# Patient Record
Sex: Male | Born: 1950 | Race: Black or African American | Hispanic: No | Smoking: Current every day smoker
Health system: Southern US, Community
[De-identification: ages and names within clinical notes are randomized; demographics above are authoritative.]

## PROBLEM LIST (undated history)

## (undated) DIAGNOSIS — M1990 Unspecified osteoarthritis, unspecified site: Secondary | ICD-10-CM

## (undated) DIAGNOSIS — J45909 Unspecified asthma, uncomplicated: Secondary | ICD-10-CM

## (undated) DIAGNOSIS — J449 Chronic obstructive pulmonary disease, unspecified: Secondary | ICD-10-CM

## (undated) DIAGNOSIS — I10 Essential (primary) hypertension: Secondary | ICD-10-CM

## (undated) DIAGNOSIS — G4730 Sleep apnea, unspecified: Secondary | ICD-10-CM

## (undated) DIAGNOSIS — K829 Disease of gallbladder, unspecified: Secondary | ICD-10-CM

---

## 2000-11-08 ENCOUNTER — Encounter: Payer: Self-pay | Admitting: Internal Medicine

## 2000-11-08 ENCOUNTER — Ambulatory Visit (HOSPITAL_COMMUNITY): Admission: RE | Admit: 2000-11-08 | Discharge: 2000-11-08 | Payer: Self-pay | Admitting: Internal Medicine

## 2007-01-17 ENCOUNTER — Ambulatory Visit (HOSPITAL_COMMUNITY): Admission: RE | Admit: 2007-01-17 | Discharge: 2007-01-17 | Payer: Self-pay | Admitting: Family Medicine

## 2007-01-18 ENCOUNTER — Ambulatory Visit (HOSPITAL_COMMUNITY): Admission: RE | Admit: 2007-01-18 | Discharge: 2007-01-18 | Payer: Self-pay | Admitting: Family Medicine

## 2009-06-13 ENCOUNTER — Encounter: Payer: Self-pay | Admitting: Internal Medicine

## 2009-06-30 ENCOUNTER — Telehealth (INDEPENDENT_AMBULATORY_CARE_PROVIDER_SITE_OTHER): Payer: Self-pay

## 2009-07-01 ENCOUNTER — Encounter: Payer: Self-pay | Admitting: Internal Medicine

## 2009-07-04 ENCOUNTER — Ambulatory Visit: Payer: Self-pay | Admitting: Internal Medicine

## 2009-07-04 ENCOUNTER — Ambulatory Visit (HOSPITAL_COMMUNITY): Admission: RE | Admit: 2009-07-04 | Discharge: 2009-07-04 | Payer: Self-pay | Admitting: Internal Medicine

## 2009-07-08 ENCOUNTER — Encounter: Payer: Self-pay | Admitting: Internal Medicine

## 2009-11-01 ENCOUNTER — Ambulatory Visit (HOSPITAL_COMMUNITY): Admission: RE | Admit: 2009-11-01 | Discharge: 2009-11-01 | Payer: Self-pay | Admitting: Allergy and Immunology

## 2010-12-22 ENCOUNTER — Ambulatory Visit (HOSPITAL_COMMUNITY)
Admission: RE | Admit: 2010-12-22 | Discharge: 2010-12-22 | Disposition: A | Discharge status: Home / Self Care | Payer: BC Managed Care – PPO | Source: Ambulatory Visit | Attending: Family Medicine | Admitting: Family Medicine

## 2010-12-22 ENCOUNTER — Other Ambulatory Visit (HOSPITAL_COMMUNITY): Payer: Self-pay | Admitting: Family Medicine

## 2010-12-22 DIAGNOSIS — M545 Low back pain, unspecified: Secondary | ICD-10-CM

## 2010-12-22 DIAGNOSIS — M79609 Pain in unspecified limb: Secondary | ICD-10-CM | POA: Insufficient documentation

## 2010-12-22 DIAGNOSIS — M25559 Pain in unspecified hip: Secondary | ICD-10-CM | POA: Insufficient documentation

## 2011-07-10 ENCOUNTER — Other Ambulatory Visit (HOSPITAL_COMMUNITY): Payer: Self-pay | Admitting: Family Medicine

## 2011-07-12 ENCOUNTER — Ambulatory Visit (HOSPITAL_COMMUNITY)
Admission: RE | Admit: 2011-07-12 | Discharge: 2011-07-12 | Disposition: A | Discharge status: Home / Self Care | Payer: BC Managed Care – PPO | Source: Ambulatory Visit | Attending: Family Medicine | Admitting: Family Medicine

## 2011-07-12 DIAGNOSIS — R109 Unspecified abdominal pain: Secondary | ICD-10-CM | POA: Insufficient documentation

## 2011-07-12 DIAGNOSIS — R932 Abnormal findings on diagnostic imaging of liver and biliary tract: Secondary | ICD-10-CM | POA: Insufficient documentation

## 2012-08-10 ENCOUNTER — Emergency Department (HOSPITAL_COMMUNITY)
Admission: EM | Admit: 2012-08-10 | Discharge: 2012-08-10 | Disposition: A | Discharge status: Home / Self Care | Payer: BC Managed Care – PPO | Attending: Emergency Medicine | Admitting: Emergency Medicine

## 2012-08-10 ENCOUNTER — Encounter (HOSPITAL_COMMUNITY): Payer: Self-pay | Admitting: *Deleted

## 2012-08-10 DIAGNOSIS — F17200 Nicotine dependence, unspecified, uncomplicated: Secondary | ICD-10-CM | POA: Insufficient documentation

## 2012-08-10 DIAGNOSIS — R748 Abnormal levels of other serum enzymes: Secondary | ICD-10-CM | POA: Insufficient documentation

## 2012-08-10 DIAGNOSIS — K8020 Calculus of gallbladder without cholecystitis without obstruction: Secondary | ICD-10-CM | POA: Insufficient documentation

## 2012-08-10 DIAGNOSIS — Z8719 Personal history of other diseases of the digestive system: Secondary | ICD-10-CM | POA: Insufficient documentation

## 2012-08-10 DIAGNOSIS — K8050 Calculus of bile duct without cholangitis or cholecystitis without obstruction: Secondary | ICD-10-CM

## 2012-08-10 DIAGNOSIS — R630 Anorexia: Secondary | ICD-10-CM | POA: Insufficient documentation

## 2012-08-10 DIAGNOSIS — R110 Nausea: Secondary | ICD-10-CM | POA: Insufficient documentation

## 2012-08-10 HISTORY — DX: Disease of gallbladder, unspecified: K82.9

## 2012-08-10 LAB — COMPREHENSIVE METABOLIC PANEL
AST: 13 U/L (ref 0–37)
Albumin: 3.7 g/dL (ref 3.5–5.2)
Alkaline Phosphatase: 77 U/L (ref 39–117)
Chloride: 95 mEq/L — ABNORMAL LOW (ref 96–112)
Potassium: 4.9 mEq/L (ref 3.5–5.1)
Sodium: 133 mEq/L — ABNORMAL LOW (ref 135–145)
Total Bilirubin: 0.6 mg/dL (ref 0.3–1.2)

## 2012-08-10 LAB — URINE MICROSCOPIC-ADD ON

## 2012-08-10 LAB — CBC WITH DIFFERENTIAL/PLATELET
Basophils Absolute: 0 10*3/uL (ref 0.0–0.1)
Basophils Relative: 0 % (ref 0–1)
MCHC: 36.3 g/dL — ABNORMAL HIGH (ref 30.0–36.0)
Neutro Abs: 5.8 10*3/uL (ref 1.7–7.7)
Neutrophils Relative %: 72 % (ref 43–77)
RDW: 13.5 % (ref 11.5–15.5)

## 2012-08-10 LAB — URINALYSIS, ROUTINE W REFLEX MICROSCOPIC
Bilirubin Urine: NEGATIVE
Glucose, UA: NEGATIVE mg/dL
Leukocytes, UA: NEGATIVE
pH: 5.5 (ref 5.0–8.0)

## 2012-08-10 MED ORDER — HYDROMORPHONE HCL PF 1 MG/ML IJ SOLN
1.0000 mg | Freq: Once | INTRAMUSCULAR | Status: AC
  Administered 2012-08-10: 1 mg via INTRAVENOUS
  Filled 2012-08-10: qty 1

## 2012-08-10 MED ORDER — METOPROLOL TARTRATE 1 MG/ML IV SOLN
5.0000 mg | Freq: Once | INTRAVENOUS | Status: AC
  Administered 2012-08-10: 5 mg via INTRAVENOUS
  Filled 2012-08-10: qty 5

## 2012-08-10 MED ORDER — SODIUM CHLORIDE 0.9 % IV BOLUS (SEPSIS)
1000.0000 mL | Freq: Once | INTRAVENOUS | Status: AC
  Administered 2012-08-10: 1000 mL via INTRAVENOUS

## 2012-08-10 MED ORDER — ONDANSETRON HCL 4 MG/2ML IJ SOLN
4.0000 mg | Freq: Once | INTRAMUSCULAR | Status: AC
  Administered 2012-08-10: 4 mg via INTRAVENOUS
  Filled 2012-08-10: qty 2

## 2012-08-10 NOTE — ED Notes (Signed)
Pt c/o epigastric pain that radiates to chest area with burning sensation, pain is associated with nausea. Pain started a week ago,

## 2012-08-10 NOTE — ED Notes (Signed)
Dr Rosalia Hammers notified of [place] un-availability at this time. Aware of scheduling needed for tomorrow if still wanted.

## 2012-08-10 NOTE — ED Notes (Signed)
Pt presents with epi-gastric pain, x 3 days. Pt states pain is in upper abdomin with tenderness noted upon palpation. Pt reports no bm x 5 days. Has had pain like this about 4 years ago, was diagnosed as gallbladder disease per report. Pt refused surgery at that time.  NAD noted at this time. Pt is alert, oriented x 4.

## 2012-08-10 NOTE — ED Notes (Signed)
RN at bedside

## 2012-08-10 NOTE — ED Provider Notes (Signed)
History     CSN: [id number]  Arrival date & time 08/10/12  1228   First MD Initiated Contact with Patient 08/10/12 1445      Chief Complaint  Patient presents with  . Abdominal Pain    (Consider location/radiation/quality/duration/timing/severity/associated sxs/prior treatment) HPI Patient with epigastric pain for 3-4 days with pain and nausea.  Pain like prior pain with gallbladder.  Patient scheduled to get gb out 4 years ago but canceled and had changed diet.  He is not currently having pain but has not eaten today due to pain with eating.  No fever, chills, vomiting, diarrhea.    Past Medical History  Diagnosis Date  . Gallbladder disease     History reviewed. No pertinent past surgical history.  No family history on file.  History  Substance Use Topics  . Smoking status: Current Every Day Smoker  . Smokeless tobacco: Not on file  . Alcohol Use: No      Review of Systems  Constitutional: Positive for appetite change.  HENT: Negative.   Respiratory: Negative.   Cardiovascular: Negative.   Gastrointestinal: Positive for abdominal pain.  Genitourinary: Negative.   Musculoskeletal: Negative.   Skin: Negative.   Neurological: Negative.   Hematological: Negative.   Psychiatric/Behavioral: Negative.   All other systems reviewed and are negative.    Allergies  Aspirin  Home Medications  No current outpatient prescriptions on file.  BP 171/112  Pulse 97  Temp 98.1 F (36.7 C)  Resp 20  Ht 5' 11.5" (1.816 m)  Wt 240 lb (108.863 kg)  BMI 33.01 kg/m2  SpO2 98%  Physical Exam  Nursing note and vitals reviewed. Constitutional: He is oriented to person, place, and time. He appears well-developed and well-nourished.  HENT:  Head: Normocephalic and atraumatic.  Right Ear: External ear normal.  Left Ear: External ear normal.  Nose: Nose normal.  Mouth/Throat: Oropharynx is clear and moist.  Eyes: Conjunctivae normal and EOM are normal. Pupils are equal,  round, and reactive to light.  Neck: Normal range of motion. Neck supple.  Cardiovascular: Normal rate, regular rhythm, normal heart sounds and intact distal pulses.   Pulmonary/Chest: Effort normal and breath sounds normal. No respiratory distress. He has no wheezes. He exhibits no tenderness.  Abdominal: Soft. Bowel sounds are normal. He exhibits no distension and no mass. There is tenderness. There is no guarding.       Epigastric pain  Musculoskeletal: Normal range of motion.  Neurological: He is alert and oriented to person, place, and time. He has normal reflexes. He exhibits normal muscle tone. Coordination normal.  Skin: Skin is warm and dry.  Psychiatric: He has a normal mood and affect. His behavior is normal. Judgment and thought content normal.    ED Course  Procedures (including critical care time)   Labs Reviewed  CBC WITH DIFFERENTIAL  COMPREHENSIVE METABOLIC PANEL  LIPASE, BLOOD  URINALYSIS, ROUTINE W REFLEX MICROSCOPIC   No results found.   No diagnosis found.   Results for orders placed during the hospital encounter of 08/10/12  CBC WITH DIFFERENTIAL      Component Value Range   WBC 8.1  4.0 - 10.5 K/uL   RBC 5.54  4.22 - 5.81 MIL/uL   Hemoglobin 18.2 (*) 13.0 - 17.0 g/dL   HCT 40.9  81.1 - 91.4 %   MCV 90.4  78.0 - 100.0 fL   MCH 32.9  26.0 - 34.0 pg   MCHC 36.3 (*) 30.0 - 36.0 g/dL  RDW 13.5  11.5 - 15.5 %   Platelets 256  150 - 400 K/uL   Neutrophils Relative 72  43 - 77 %   Neutro Abs 5.8  1.7 - 7.7 K/uL   Lymphocytes Relative 20  12 - 46 %   Lymphs Abs 1.6  0.7 - 4.0 K/uL   Monocytes Relative 6  3 - 12 %   Monocytes Absolute 0.5  0.1 - 1.0 K/uL   Eosinophils Relative 3  0 - 5 %   Eosinophils Absolute 0.2  0.0 - 0.7 K/uL   Basophils Relative 0  0 - 1 %   Basophils Absolute 0.0  0.0 - 0.1 K/uL  COMPREHENSIVE METABOLIC PANEL      Component Value Range   Sodium 133 (*) 135 - 145 mEq/L   Potassium 4.9  3.5 - 5.1 mEq/L   Chloride 95 (*) 96 - 112  mEq/L   CO2 29  19 - 32 mEq/L   Glucose, Bld 122 (*) 70 - 99 mg/dL   BUN 16  6 - 23 mg/dL   Creatinine, Ser 1.61  0.50 - 1.35 mg/dL   Calcium 9.8  8.4 - 09.6 mg/dL   Total Protein 8.6 (*) 6.0 - 8.3 g/dL   Albumin 3.7  3.5 - 5.2 g/dL   AST 13  0 - 37 U/L   ALT 16  0 - 53 U/L   Alkaline Phosphatase 77  39 - 117 U/L   Total Bilirubin 0.6  0.3 - 1.2 mg/dL   GFR calc non Af Amer 62 (*) >90 mL/min   GFR calc Af Amer 72 (*) >90 mL/min  LIPASE, BLOOD      Component Value Range   Lipase 80 (*) 11 - 59 U/L    MDM    Discussed with Dr. Leticia Penna and patient  Patient offered admission but wishes d/c to follow up outpatient.  History of [place] with gallstones and no evidence of acute choleycystitis.  H.o. C.w. Biliary colic.  Patient advised clear liquids only and to take bp meds.  He is to call Dr. Illene Regulus office tomorrow morning to be seen on Tuesday.  Patient voices understanding and to return if worse especially intractable pain , fever.      Hilario Quarry, MD 08/10/12 [phone]

## 2012-08-11 ENCOUNTER — Emergency Department (HOSPITAL_COMMUNITY)
Admission: EM | Admit: 2012-08-11 | Discharge: 2012-08-11 | Disposition: A | Discharge status: Home / Self Care | Payer: BC Managed Care – PPO | Attending: Emergency Medicine | Admitting: Emergency Medicine

## 2012-08-11 ENCOUNTER — Encounter (HOSPITAL_COMMUNITY): Payer: Self-pay | Admitting: *Deleted

## 2012-08-11 DIAGNOSIS — I10 Essential (primary) hypertension: Secondary | ICD-10-CM

## 2012-08-11 DIAGNOSIS — R1013 Epigastric pain: Secondary | ICD-10-CM | POA: Insufficient documentation

## 2012-08-11 DIAGNOSIS — R109 Unspecified abdominal pain: Secondary | ICD-10-CM

## 2012-08-11 DIAGNOSIS — F17200 Nicotine dependence, unspecified, uncomplicated: Secondary | ICD-10-CM | POA: Insufficient documentation

## 2012-08-11 DIAGNOSIS — Z79899 Other long term (current) drug therapy: Secondary | ICD-10-CM | POA: Insufficient documentation

## 2012-08-11 DIAGNOSIS — Z8719 Personal history of other diseases of the digestive system: Secondary | ICD-10-CM | POA: Insufficient documentation

## 2012-08-11 DIAGNOSIS — R110 Nausea: Secondary | ICD-10-CM | POA: Insufficient documentation

## 2012-08-11 MED ORDER — ONDANSETRON HCL 8 MG PO TABS: 8.0000 mg | ORAL_TABLET | Freq: Three times a day (TID) | ORAL | Status: DC | PRN

## 2012-08-11 MED ORDER — OXYCODONE-ACETAMINOPHEN 5-325 MG PO TABS: 2.0000 | ORAL_TABLET | ORAL | Status: DC | PRN

## 2012-08-11 NOTE — ED Provider Notes (Signed)
History     CSN: [id number]  Arrival date & time 08/11/12  1016   First MD Initiated Contact with Patient 08/11/12 1114      Chief Complaint  Patient presents with  . Abdominal Pain     Patient is a 62 y.o. male presenting with abdominal pain. The history is provided by the patient. No language interpreter was used.  Abdominal Pain The primary symptoms of the illness include abdominal pain, fever and nausea. The primary symptoms of the illness do not include fatigue, shortness of breath, vomiting, diarrhea or hematemesis. The current episode started 13 to 24 hours ago. The onset of the illness was sudden. The problem has been gradually improving.  The abdominal pain began 13 to24 hours ago. The pain came on suddenly. The abdominal pain has been unchanged since its onset. The abdominal pain is located in the epigastric region. The abdominal pain does not radiate. The abdominal pain is relieved by nothing.  The fever began today. The maximum temperature recorded prior to his arrival was unknown.   Symptoms associated with the illness do not include chills or constipation.    George Mays is a 62 y.o. male who presents to the Emergency Department complaining of epigastric abdominal pain. He was seen yesterday and was diagnosed with biliary colic with elevated lipase yesterday. He states he was advised to see Dr.Zieglar tomorrow but has not called to make an appointment. He states he is feeling better than yesterday but is in pain.  Past Medical History  Diagnosis Date  . Gallbladder disease     History reviewed. No pertinent past surgical history.  No family history on file.  History  Substance Use Topics  . Smoking status: Current Every Day Smoker  . Smokeless tobacco: Not on file  . Alcohol Use: No      Review of Systems  Constitutional: Positive for fever. Negative for chills and fatigue.  Respiratory: Negative for shortness of breath.   Cardiovascular: Negative  for chest pain.  Gastrointestinal: Positive for nausea and abdominal pain. Negative for vomiting, diarrhea, constipation and hematemesis.  Neurological: Negative for weakness, light-headedness, numbness and headaches.    Allergies  Aspirin  Home Medications   Current Outpatient Rx  Name  Route  Sig  Dispense  Refill  . AMLODIPINE-OLMESARTAN 5-20 MG PO TABS   Oral   Take 1 tablet by mouth daily.         Marland Kitchen CETIRIZINE HCL 10 MG PO TABS   Oral   Take 10 mg by mouth daily.         Marland Kitchen FLUTICASONE-SALMETEROL 250-50 MCG/DOSE IN AEPB   Inhalation   Inhale 1 puff into the lungs every 12 (twelve) hours.           Triage Vitals: BP 153/101  Pulse 90  Temp 97.8 F (36.6 C) (Oral)  Resp 16  Ht 5\' 11"  (1.803 m)  Wt 240 lb (108.863 kg)  BMI 33.47 kg/m2  SpO2 95%  Physical Exam  CONSTITUTIONAL: Well developed/well nourished HEAD AND FACE: Normocephalic/atraumatic EYES: EOMI/PERRL ENMT: Mucous membranes moist NECK: supple no meningeal signs SPINE:entire spine nontender CV: S1/S2 noted, no murmurs/rubs/gallops noted LUNGS: Lungs are clear to auscultation bilaterally, no apparent distress ABDOMEN: soft, nontender, no rebound or guarding GU:no cva tenderness NEURO: Pt is awake/alert, moves all extremitiesx4 EXTREMITIES: pulses normal, full ROM SKIN: warm, color normal PSYCH: no abnormalities of mood noted   ED Course  Procedures   DIAGNOSTIC STUDIES: Oxygen Saturation is 95%  on room air, normal by my interpretation.    COORDINATION OF CARE:  11:23 AM: Discussed treatment plan which includes prescription for pain medication and an appoint with Dr. Leticia Penna with pt at bedside and pt agreed to plan.  Exam unremarkable.  Reports he has had this pain on/off for awhile with recent worsening.  Do not feel emergent imaging necessary.  F/u arranged.  Pt stable for d/c.  Short course of pain meds given    MDM  Previous records reviewed and considered Labs/vital reviewed and  considered Nursing notes including past medical history and social history reviewed and considered in documentation      Date: 08/11/2012  Rate: 77  Rhythm: normal sinus rhythm  QRS Axis: left  Intervals: normal  ST/T Wave abnormalities: nonspecific ST changes  Conduction Disutrbances:none  Narrative Interpretation:   Old EKG Reviewed: unchanged     I personally performed the services described in this documentation, which was scribed in my presence. The recorded information has been reviewed and is accurate.      Joya Gaskins, MD 08/11/12 [phone]

## 2012-08-11 NOTE — ED Notes (Addendum)
Pt dx with biliary colic with elevated lipase yesterday and is supposed to follow up with Dr. Leticia Penna tomorrow but has not called to make an appt yet this morning. States he was not given Rx pain med at time of d/c and is having a lot of pain.

## 2012-08-14 ENCOUNTER — Encounter (HOSPITAL_COMMUNITY): Payer: Self-pay | Admitting: Pharmacy Technician

## 2012-08-15 ENCOUNTER — Encounter (HOSPITAL_COMMUNITY)
Admission: RE | Admit: 2012-08-15 | Discharge: 2012-08-15 | Disposition: A | Discharge status: Home / Self Care | Payer: BC Managed Care – PPO | Source: Ambulatory Visit | Attending: Internal Medicine | Admitting: Internal Medicine

## 2012-08-15 ENCOUNTER — Encounter (HOSPITAL_COMMUNITY): Payer: Self-pay

## 2012-08-15 HISTORY — DX: Essential (primary) hypertension: I10

## 2012-08-15 HISTORY — DX: Sleep apnea, unspecified: G47.30

## 2012-08-15 HISTORY — DX: Unspecified asthma, uncomplicated: J45.909

## 2012-08-15 LAB — SURGICAL PCR SCREEN
MRSA, PCR: NEGATIVE
Staphylococcus aureus: NEGATIVE

## 2012-08-15 NOTE — Patient Instructions (Addendum)
George Mays  08/15/2012   Your procedure is scheduled on:  08/18/2012  Report to [facility] at 850  AM.  Call this number if you have problems the morning of surgery: [phone]   Remember:   Do not eat food or drink liquids after midnight.   Take these medicines the morning of surgery with A SIP OF WATER: zofran,oxycodone,azor,zyrtec   Do not wear jewelry, make-up or nail polish.  Do not wear lotions, powders, or perfumes. You may wear deodorant.  Do not shave 48 hours prior to surgery. Men may shave face and neck.  Do not bring valuables to the hospital.  Contacts, dentures or bridgework may not be worn into surgery.  Leave suitcase in the car. After surgery it may be brought to your room.  For patients admitted to the hospital, checkout time is 11:00 AM the day of discharge.   Patients discharged the day of surgery will not be allowed to drive  home.  Name and phone number of your driver: family  Special Instructions: Shower using CHG 2 nights before surgery and the night before surgery.  If you shower the day of surgery use CHG.  Use special wash - you have one bottle of CHG for all showers.  You should use approximately 1/3 of the bottle for each shower.   Please read over the following fact sheets that you were given: Pain Booklet, Coughing and Deep Breathing, MRSA Information, Surgical Site Infection Prevention, Anesthesia Post-op Instructions and Care and Recovery After Surgery Laparoscopic Cholecystectomy Laparoscopic cholecystectomy is surgery to remove the gallbladder. The gallbladder is located slightly to the right of center in the abdomen, behind the liver. It is a concentrating and storage sac for the bile produced in the liver. Bile aids in the digestion and absorption of fats. Gallbladder disease (cholecystitis) is an inflammation of your gallbladder. This condition is usually caused by a buildup of gallstones (cholelithiasis) in your gallbladder. Gallstones  can block the flow of bile, resulting in inflammation and pain. In severe cases, emergency surgery may be required. When emergency surgery is not required, you will have time to prepare for the procedure. Laparoscopic surgery is an alternative to open surgery. Laparoscopic surgery usually has a shorter recovery time. Your common bile duct may also need to be examined and explored. Your caregiver will discuss this with you if he or she feels this should be done. If stones are found in the common bile duct, they may be removed. LET YOUR CAREGIVER KNOW ABOUT:  Allergies to food or medicine.  Medicines taken, including vitamins, herbs, eyedrops, over-the-counter medicines, and creams.  Use of steroids (by mouth or creams).  Previous problems with anesthetics or numbing medicines.  History of bleeding problems or blood clots.  Previous surgery.  Other health problems, including diabetes and kidney problems.  Possibility of pregnancy, if this applies. RISKS AND COMPLICATIONS All surgery is associated with risks. Some problems that may occur following this procedure include:  Infection.  Damage to the common bile duct, nerves, arteries, veins, or other internal organs such as the stomach or intestines.  Bleeding.  A stone may remain in the common bile duct. BEFORE THE PROCEDURE  Do not take aspirin for 3 days prior to surgery or blood thinners for 1 week prior to surgery.  Do not eat or drink anything after midnight the night before surgery.  Let your caregiver know if you develop a cold or other infectious problem prior to  surgery.  You should be present 60 minutes before the procedure or as directed. PROCEDURE  You will be given medicine that makes you sleep (general anesthetic). When you are asleep, your surgeon will make several small cuts (incisions) in your abdomen. One of these incisions is used to insert a small, lighted scope (laparoscope) into the abdomen. The laparoscope  helps the surgeon see into your abdomen. Carbon dioxide gas will be pumped into your abdomen. The gas allows more room for the surgeon to perform your surgery. Other operating instruments are inserted through the other incisions. Laparoscopic procedures may not be appropriate when:  There is major scarring from previous surgery.  The gallbladder is extremely inflamed.  There are bleeding disorders or unexpected cirrhosis of the liver.  A pregnancy is near term.  Other conditions make the laparoscopic procedure impossible. If your surgeon feels it is not safe to continue with a laparoscopic procedure, he or she will perform an open abdominal procedure. In this case, the surgeon will make an incision to open the abdomen. This gives the surgeon a larger view and field to work within. This may allow the surgeon to perform procedures that sometimes cannot be performed with a laparoscope alone. Open surgery has a longer recovery time. AFTER THE PROCEDURE  You will be taken to the recovery area where a nurse will watch and check your progress.  You may be allowed to go home the same day.  Do not resume physical activities until directed by your caregiver.  You may resume a normal diet and activities as directed. Document Released: 07/16/2005 Document Revised: 10/08/2011 Document Reviewed: 12/29/2010 [facility] Patient Information 2013 [place], [place]. PATIENT INSTRUCTIONS POST-ANESTHESIA  IMMEDIATELY FOLLOWING SURGERY:  Do not drive or operate machinery for the first twenty four hours after surgery.  Do not make any important decisions for twenty four hours after surgery or while taking narcotic pain medications or sedatives.  If you develop intractable nausea and vomiting or a severe headache please notify your doctor immediately.  FOLLOW-UP:  Please make an appointment with your surgeon as instructed. You do not need to follow up with anesthesia unless specifically instructed to do so.  WOUND  CARE INSTRUCTIONS (if applicable):  Keep a dry clean dressing on the anesthesia/puncture wound site if there is drainage.  Once the wound has quit draining you may leave it open to air.  Generally you should leave the bandage intact for twenty four hours unless there is drainage.  If the epidural site drains for more than 36-48 hours please call the anesthesia department.  QUESTIONS?:  Please feel free to call your physician or the hospital operator if you have any questions, and they will be happy to assist you.

## 2012-08-18 ENCOUNTER — Encounter (HOSPITAL_COMMUNITY)
Admission: RE | Disposition: A | Discharge status: Home / Self Care | Payer: Self-pay | Source: Ambulatory Visit | Attending: General Surgery

## 2012-08-18 ENCOUNTER — Ambulatory Visit (HOSPITAL_COMMUNITY)
Admission: RE | Admit: 2012-08-18 | Discharge: 2012-08-18 | Disposition: A | Discharge status: Home / Self Care | Payer: BC Managed Care – PPO | Source: Ambulatory Visit | Attending: General Surgery | Admitting: General Surgery

## 2012-08-18 ENCOUNTER — Encounter (HOSPITAL_COMMUNITY): Payer: Self-pay | Admitting: Anesthesiology

## 2012-08-18 ENCOUNTER — Encounter (HOSPITAL_COMMUNITY): Payer: Self-pay | Admitting: *Deleted

## 2012-08-18 ENCOUNTER — Ambulatory Visit (HOSPITAL_COMMUNITY): Payer: BC Managed Care – PPO | Admitting: Anesthesiology

## 2012-08-18 DIAGNOSIS — I10 Essential (primary) hypertension: Secondary | ICD-10-CM | POA: Insufficient documentation

## 2012-08-18 DIAGNOSIS — G4733 Obstructive sleep apnea (adult) (pediatric): Secondary | ICD-10-CM | POA: Insufficient documentation

## 2012-08-18 DIAGNOSIS — K819 Cholecystitis, unspecified: Secondary | ICD-10-CM

## 2012-08-18 DIAGNOSIS — K8020 Calculus of gallbladder without cholecystitis without obstruction: Secondary | ICD-10-CM | POA: Insufficient documentation

## 2012-08-18 HISTORY — PX: CHOLECYSTECTOMY: SHX55

## 2012-08-18 SURGERY — LAPAROSCOPIC CHOLECYSTECTOMY
Anesthesia: General | Site: Abdomen | Wound class: Contaminated

## 2012-08-18 MED ORDER — GLYCOPYRROLATE 0.2 MG/ML IJ SOLN
INTRAMUSCULAR | Status: AC
  Filled 2012-08-18: qty 1

## 2012-08-18 MED ORDER — FENTANYL CITRATE 0.05 MG/ML IJ SOLN
25.0000 ug | INTRAMUSCULAR | Status: DC | PRN
  Administered 2012-08-18 (×4): 50 ug via INTRAVENOUS

## 2012-08-18 MED ORDER — FENTANYL CITRATE 0.05 MG/ML IJ SOLN
INTRAMUSCULAR | Status: AC
  Filled 2012-08-18: qty 5

## 2012-08-18 MED ORDER — NEOSTIGMINE METHYLSULFATE 1 MG/ML IJ SOLN
INTRAMUSCULAR | Status: AC
  Filled 2012-08-18: qty 1

## 2012-08-18 MED ORDER — BUPIVACAINE HCL (PF) 0.5 % IJ SOLN
INTRAMUSCULAR | Status: DC | PRN
  Administered 2012-08-18: 10 mL

## 2012-08-18 MED ORDER — HEMOSTATIC AGENTS (NO CHARGE) OPTIME
TOPICAL | Status: DC | PRN
  Administered 2012-08-18: 1 via TOPICAL

## 2012-08-18 MED ORDER — CEFAZOLIN SODIUM-DEXTROSE 2-3 GM-% IV SOLR
2.0000 g | INTRAVENOUS | Status: AC
  Administered 2012-08-18: 2 g via INTRAVENOUS

## 2012-08-18 MED ORDER — LACTATED RINGERS IV SOLN
INTRAVENOUS | Status: DC
  Administered 2012-08-18: 12:00:00 via INTRAVENOUS
  Administered 2012-08-18: 1000 mL via INTRAVENOUS

## 2012-08-18 MED ORDER — FENTANYL CITRATE 0.05 MG/ML IJ SOLN
INTRAMUSCULAR | Status: DC | PRN
  Administered 2012-08-18: 50 ug via INTRAVENOUS
  Administered 2012-08-18: 100 ug via INTRAVENOUS
  Administered 2012-08-18 (×2): 50 ug via INTRAVENOUS

## 2012-08-18 MED ORDER — CHLORHEXIDINE GLUCONATE 4 % EX LIQD: 1.0000 "application " | Freq: Once | CUTANEOUS | Status: DC

## 2012-08-18 MED ORDER — LACTATED RINGERS IV SOLN: INTRAVENOUS | Status: DC | PRN

## 2012-08-18 MED ORDER — BUPIVACAINE HCL (PF) 0.5 % IJ SOLN
INTRAMUSCULAR | Status: AC
  Filled 2012-08-18: qty 30

## 2012-08-18 MED ORDER — SODIUM CHLORIDE 0.9 % IR SOLN
Status: DC | PRN
  Administered 2012-08-18: 1000 mL

## 2012-08-18 MED ORDER — MIDAZOLAM HCL 2 MG/2ML IJ SOLN
1.0000 mg | INTRAMUSCULAR | Status: DC | PRN
  Administered 2012-08-18: 2 mg via INTRAVENOUS

## 2012-08-18 MED ORDER — ONDANSETRON HCL 4 MG/2ML IJ SOLN
4.0000 mg | Freq: Once | INTRAMUSCULAR | Status: AC
  Administered 2012-08-18: 4 mg via INTRAVENOUS

## 2012-08-18 MED ORDER — FENTANYL CITRATE 0.05 MG/ML IJ SOLN
INTRAMUSCULAR | Status: AC
  Filled 2012-08-18: qty 2

## 2012-08-18 MED ORDER — ONDANSETRON HCL 4 MG/2ML IJ SOLN
4.0000 mg | Freq: Once | INTRAMUSCULAR | Status: AC | PRN
  Administered 2012-08-18: 4 mg via INTRAVENOUS

## 2012-08-18 MED ORDER — MIDAZOLAM HCL 2 MG/2ML IJ SOLN
INTRAMUSCULAR | Status: AC
  Filled 2012-08-18: qty 2

## 2012-08-18 MED ORDER — ONDANSETRON HCL 4 MG/2ML IJ SOLN
INTRAMUSCULAR | Status: AC
  Filled 2012-08-18: qty 2

## 2012-08-18 MED ORDER — GLYCOPYRROLATE 0.2 MG/ML IJ SOLN
INTRAMUSCULAR | Status: AC
  Filled 2012-08-18: qty 3

## 2012-08-18 MED ORDER — OXYCODONE-ACETAMINOPHEN 5-325 MG PO TABS: 1.0000 | ORAL_TABLET | ORAL | Status: DC | PRN

## 2012-08-18 MED ORDER — SUCCINYLCHOLINE CHLORIDE 20 MG/ML IJ SOLN
INTRAMUSCULAR | Status: DC | PRN
  Administered 2012-08-18: 140 mg via INTRAVENOUS

## 2012-08-18 MED ORDER — CEFAZOLIN SODIUM-DEXTROSE 2-3 GM-% IV SOLR
INTRAVENOUS | Status: AC
  Filled 2012-08-18: qty 50

## 2012-08-18 MED ORDER — SODIUM CHLORIDE 0.9 % IR SOLN
Status: DC | PRN
  Administered 2012-08-18: 3000 mL

## 2012-08-18 MED ORDER — GLYCOPYRROLATE 0.2 MG/ML IJ SOLN
0.2000 mg | Freq: Once | INTRAMUSCULAR | Status: AC
  Administered 2012-08-18: 0.2 mg via INTRAVENOUS

## 2012-08-18 MED ORDER — ROCURONIUM BROMIDE 100 MG/10ML IV SOLN
INTRAVENOUS | Status: DC | PRN
  Administered 2012-08-18: 5 mg via INTRAVENOUS
  Administered 2012-08-18: 25 mg via INTRAVENOUS

## 2012-08-18 MED ORDER — LIDOCAINE HCL (CARDIAC) 10 MG/ML IV SOLN
INTRAVENOUS | Status: DC | PRN
  Administered 2012-08-18: 20 mg via INTRAVENOUS

## 2012-08-18 MED ORDER — PROPOFOL 10 MG/ML IV EMUL
INTRAVENOUS | Status: DC | PRN
  Administered 2012-08-18: 20 mg via INTRAVENOUS
  Administered 2012-08-18: 180 mg via INTRAVENOUS

## 2012-08-18 SURGICAL SUPPLY — 42 items
APL SKNCLS STERI-STRIP NONHPOA (GAUZE/BANDAGES/DRESSINGS) ×1
APPLIER CLIP UNV 5X34 EPIX (ENDOMECHANICALS) ×2 IMPLANT
APR XCLPCLP 20M/L UNV 34X5 (ENDOMECHANICALS) ×1
BAG HAMPER (MISCELLANEOUS) ×2 IMPLANT
BAG SPEC RTRVL LRG 6X4 10 (ENDOMECHANICALS) ×1
BENZOIN TINCTURE PRP APPL 2/3 (GAUZE/BANDAGES/DRESSINGS) ×2 IMPLANT
CLOTH BEACON ORANGE TIMEOUT ST (SAFETY) ×2 IMPLANT
COVER LIGHT HANDLE STERIS (MISCELLANEOUS) ×4 IMPLANT
DECANTER SPIKE VIAL GLASS SM (MISCELLANEOUS) ×2 IMPLANT
DEVICE TROCAR PUNCTURE CLOSURE (ENDOMECHANICALS) ×2 IMPLANT
DURAPREP 26ML APPLICATOR (WOUND CARE) ×2 IMPLANT
ELECT REM PT RETURN 9FT ADLT (ELECTROSURGICAL) ×2
ELECTRODE REM PT RTRN 9FT ADLT (ELECTROSURGICAL) ×1 IMPLANT
FILTER SMOKE EVAC LAPAROSHD (FILTER) ×2 IMPLANT
FORMALIN 10 PREFIL 120ML (MISCELLANEOUS) ×2 IMPLANT
GLOVE BIOGEL PI IND STRL 7.5 (GLOVE) ×1 IMPLANT
GLOVE BIOGEL PI INDICATOR 7.5 (GLOVE) ×1
GLOVE ECLIPSE 7.0 STRL STRAW (GLOVE) ×2 IMPLANT
GLOVE INDICATOR 7.0 STRL GRN (GLOVE) ×4 IMPLANT
GLOVE SS BIOGEL STRL SZ 6.5 (GLOVE) IMPLANT
GLOVE SUPERSENSE BIOGEL SZ 6.5 (GLOVE) ×4
GOWN STRL REIN XL XLG (GOWN DISPOSABLE) ×8 IMPLANT
HEMOSTAT SNOW SURGICEL 2X4 (HEMOSTASIS) ×2 IMPLANT
INST SET LAPROSCOPIC AP (KITS) ×2 IMPLANT
IV NS IRRIG 3000ML ARTHROMATIC (IV SOLUTION) ×2 IMPLANT
KIT ROOM TURNOVER APOR (KITS) ×2 IMPLANT
MANIFOLD NEPTUNE II (INSTRUMENTS) ×2 IMPLANT
NDL INSUFFLATION 14GA 120MM (NEEDLE) ×1 IMPLANT
NEEDLE INSUFFLATION 14GA 120MM (NEEDLE) ×2 IMPLANT
NS IRRIG 1000ML POUR BTL (IV SOLUTION) ×2 IMPLANT
PACK LAP CHOLE LZT030E (CUSTOM PROCEDURE TRAY) ×2 IMPLANT
PAD ARMBOARD 7.5X6 YLW CONV (MISCELLANEOUS) ×2 IMPLANT
POUCH SPECIMEN RETRIEVAL 10MM (ENDOMECHANICALS) ×2 IMPLANT
SET BASIN LINEN APH (SET/KITS/TRAYS/PACK) ×2 IMPLANT
SET TUBE IRRIG SUCTION NO TIP (IRRIGATION / IRRIGATOR) ×2 IMPLANT
SLEEVE Z-THREAD 5X100MM (TROCAR) ×4 IMPLANT
STRIP CLOSURE SKIN 1/2X4 (GAUZE/BANDAGES/DRESSINGS) ×3 IMPLANT
SUT MNCRL AB 4-0 PS2 18 (SUTURE) ×4 IMPLANT
SUT VIC AB 2-0 CT2 27 (SUTURE) ×2 IMPLANT
TROCAR Z-THRD FIOS HNDL 11X100 (TROCAR) ×2 IMPLANT
TROCAR Z-THREAD FIOS 5X100MM (TROCAR) ×2 IMPLANT
WARMER LAPAROSCOPE (MISCELLANEOUS) ×2 IMPLANT

## 2012-08-18 NOTE — Op Note (Signed)
Patient:  George Mays  DOB:  [birth year]  MRN:  [id number]   Preop Diagnosis:  Cholecystitis cholelithiasis  Postop Diagnosis:  The same  Procedure:  Laparoscopic cholecystectomy  Surgeon:  Dr. Tilford Pillar  Anes:  General endotracheal, 0.5% Sensorcaine plain for local  Indications:  This is a 62 year old male who presented to my office with a history of epigastric and right upper quadrant abdominal pain. Workup and evaluation was consistent for cholecystitis. Risks benefits alternatives a laparoscopic possible open cholecystectomy were discussed at length the patient including but not limited to risk of bleeding, infection, bile leak, small bowel injury,, intraoperative cardiac component events. Patient's questions and concerns were addressed the patient was consented for the planned procedure.  Procedure note:  Patient was taken to the operating room was placed in the supine position the or table time the general anesthetic is administered. Once patient was asleep he was endotracheally intubated by the nurse anesthetist. At this point his abdomen is prepped with DuraPrep solution and draped in standard fashion. A stab incision was created supraumbilically with 11 blade scalpel with additional dissection down to subcuticular tissue carried out using a Coker clamp. The clamp was then utilized to grasp the anterior normal fascia and lift his anteriorly. A Veress needle is inserted saline drop test is utilized confirm intraperitoneal placement the pneumoperitoneum was initiated. Once sufficient pneumoperitoneum was obtained an 11 mm insert overlap scope allowing visualization the trocar entering into the peritoneal cavity. At this point the inner cannulas removed the laparoscope was reinserted there is no evidence of any trocar or Veress needle placement injury. At this time the remaining trochars replaced a 5 mm in the epigastrium, a 5 mm in the midline, and a 5 mm in the right lateral  abdominal wall. Patient's placed into a reverse Trendelenburg left lateral decubitus position. The fundus of the gallbladder is grasped and lifted up and over the right lobe the liver. Blunt dissection is carried out to strip the peritoneal reflection off the infundibulum exposing both the cystic duct and cystic artery is entered into the infundibulum. A window was created by both the structures. 3 Nicholas placed proximally one distally and the cystic duct was divided between 2 most distal clips. Similarly 2 endoclips placed proximally one distally and the cystic artery and cystic arteries divided between 2 most distal clips. At this time electrocautery utilized dissect the collar free from the gallbladder fossa. During this dissection a small cholecystotomy was created with some small bile spillage. This was quickly controlled with a regular grasper. Once the gallbladder was free from the gallbladder fossa it was placed into an Endo Catch bag and placed up and over the right lobe the liver. To facilitate this the 10 mm scope is exchanged for a 5 mm scope. The surgical field was then copiously irrigated with sterile saline to the returning aspirate was clear with a suction irrigator. Inspection the gallbladder fossa indicate excellent hemostasis although the dollars fossa was noted to be raw. The endoclips noted be in good position with no evidence a bleeding or bile leak. He did place a piece of Surgicel snow into the gallbladder fossa. Hemostasis was excellent. At this time her my attention to closure.  Using an Endo Close suture passing device a 2-0 Vicryl sutures passed to the umbilical trocar site. With this suture and placed the gallbladder is retrieved was read the umbilical trocar site and intact Endo Catch bag. The gallbladder was sent as a perm specimen to pathology.  The pneumoperitoneum was evacuated. The trochars were removed. The Vicryl suture was secured. Local anesthetic was instilled. A 4-0  Monocryl was utilized reapproximate skin edges at all 4 trocar sites. The skin was washed dried moist dry towel. Benzoin is applied around incision. Half-inch are suture placed. The drapes removed. The patient was allowed to come out of general anesthetic was extubated and was transferred to the PACU in stable condition. At the conclusion of the procedure all instrument, sponge, needle counts are correct. Patient tolerated procedure extremely well.  Complications:  None apparent  EBL:  Less than 50 ML's  Specimen:  Gallbladder

## 2012-08-18 NOTE — Transfer of Care (Signed)
  Anesthesia Post-op Note  Patient: George Mays  Procedure(s) Performed: Procedure(s) (LRB) with comments: LAPAROSCOPIC CHOLECYSTECTOMY (N/A)  Patient Location: PACU  Anesthesia Type: General  Level of Consciousness: awake, alert , oriented and patient cooperative  Airway and Oxygen Therapy: Patient Spontanous Breathing and Patient connected to face mask oxygen  Post-op Pain: mild  Post-op Assessment: Post-op Vital signs reviewed, Patient's Cardiovascular Status Stable, Respiratory Function Stable, Patent Airway and No signs of Nausea or vomiting  Post-op Vital Signs: Reviewed and stable  Complications: No apparent anesthesia complications

## 2012-08-18 NOTE — Progress Notes (Signed)
Dr Jayme Cloud at bedside to check pt. Cleared for d/c to postop then home.

## 2012-08-18 NOTE — Anesthesia Procedure Notes (Signed)
Procedure Name: Intubation Date/Time: 08/18/2012 12:05 PM Performed by: Franco Nones Pre-anesthesia Checklist: Patient identified, Patient being monitored, Timeout performed, Emergency Drugs available and Suction available Patient Re-evaluated:Patient Re-evaluated prior to inductionOxygen Delivery Method: Circle System Utilized Preoxygenation: Pre-oxygenation with 100% oxygen Intubation Type: IV induction Ventilation: Mask ventilation without difficulty Laryngoscope Size: Miller and 3 Grade View: Grade I Tube type: Oral Tube size: 7.0 mm Number of attempts: 1 Airway Equipment and Method: stylet Placement Confirmation: ETT inserted through vocal cords under direct vision,  positive ETCO2 and breath sounds checked- equal and bilateral Secured at: 21 cm Tube secured with: Tape Dental Injury: Teeth and Oropharynx as per pre-operative assessment

## 2012-08-18 NOTE — Interval H&P Note (Signed)
History and Physical Interval Note:  08/18/2012 10:42 AM  George Mays  has presented today for surgery, with the diagnosis of Cholelithiasis  The various methods of treatment have been discussed with the patient and family. After consideration of risks, benefits and other options for treatment, the patient has consented to  Procedure(s) (LRB) with comments: LAPAROSCOPIC CHOLECYSTECTOMY (N/A) as a surgical intervention .  The patient's history has been reviewed, patient examined, no change in status, stable for surgery.  I have reviewed the patient's chart and labs.  Questions were answered to the patient's satisfaction.     Deitra Craine C

## 2012-08-18 NOTE — Anesthesia Postprocedure Evaluation (Signed)
  Anesthesia Post-op Note  Patient: George Mays  Procedure(s) Performed: Procedure(s) (LRB) with comments: LAPAROSCOPIC CHOLECYSTECTOMY (N/A)  Patient Location: PACU  Anesthesia Type: General  Level of Consciousness: awake, alert , oriented and patient cooperative  Airway and Oxygen Therapy: Patient Spontanous Breathing and Patient connected to face mask oxygen  Post-op Pain: mild  Post-op Assessment: Post-op Vital signs reviewed, Patient's Cardiovascular Status Stable, Respiratory Function Stable, Patent Airway and No signs of Nausea or vomiting  Post-op Vital Signs: Reviewed and stable  Complications: No apparent anesthesia complications  

## 2012-08-18 NOTE — H&P (Signed)
  NTS SOAP Note  Vital Signs:  Vitals as of: 08/14/2012: Systolic 139: Diastolic 80: Heart Rate 88: Temp 97.93F: Height 22ft 11.5in: Weight 238Lbs 0 Ounces: Pain Level 6: BMI 33  BMI : 32.73 kg/m2  Subjective: This 33 Years 15 Months old Male presents for of presents with 4 year history of epigastric abdominal pain. He had similar symptomatology began approximately 4 years ago but was able to minimize his symptomatology by avoiding fatty greasy foods. Over the last several months patient has again started of fatty greasy diet with notable increased epigastric pain associated with fatty greasy foods. Some radiation up into the chest and right upper quadrant. No shoulder discomfort. No history of jaundice. No change in bowel movements. No melena hematochezia or acholic stools. No unusual travel.  Review of Symptoms:  Constitutional:unremarkable   Head:unremarkable    Eyes:unremarkable   Nose/Mouth/Throat:unremarkablerhinorrhea Respiratory:unremarkable       as per history of present illness Genitourinary:unremarkable     Musculoskeletal:unremarkable   Skin:unremarkable Breast:unremarkable   Hematolgic/Lymphatic:unremarkable     Allergic/Immunologic:unremarkable     Past Medical History:  Obtained     Past Medical History  Surgical History: none Medical Problems: hyperlipidemia, COPD/asthma Psychiatric History: none Allergies: aspirin Medications: Azor, Advair   Social History:Obtained  Social History  Preferred Language: English Ethnicity: Not Hispanic / Latino Age: 62 Years 11 Months Marital Status:  L Alcohol: none Recreational drug(s): none   Smoking Status: Current every day smoker reviewed on 08/18/2012 Started Date: 07/31/1987 Packs per day: 0.50 Functional Status reviewed on mm/dd/yyyy ------------------------------------------------ Bathing: Normal Cooking: Normal Dressing: Normal Driving: Normal Eating:  Normal Managing Meds: Normal Oral Care: Normal Shopping: Normal Toileting: Normal Transferring: Normal Walking: Normal Cognitive Status reviewed on mm/dd/yyyy ------------------------------------------------ Attention: Normal Decision Making: Normal Language: Normal Memory: Normal Motor: Normal Perception: Normal Problem Solving: Normal Visual and Spatial: Normal   Family History:Obtained     Family History  Is there a family history of:no family history of biliary disease    Objective Information: General:  Well appearing, well nourished in no distress. Skin:     no rash or prominent lesions Head:Atraumatic; no masses; no abnormalities Eyes:  conjunctiva clear, EOM intact, PERRL Mouth:  Mucous membranes moist, no mucosal lesions. Throat:  no erythema, exudates or lesions. Neck:  Supple without lymphadenopathy.  Heart:  RRR, no murmur Lungs:    CTA bilaterally, no wheezes, rhonchi, rales.  Breathing unlabored. Abdomen:Soft, ND, no HSM, no masses.mild epigastric and right upper quadrant abdominal tenderness. no classic Murphy sign. Extremities:  No deformities, clubbing, cyanosis, or edema.    right upper quadrant ultrasound: Positive cholelithiasis Assessment:    Plan: cholelithiasis. Risks benefits and alternatives a laparoscopic possible open cholecystectomy were discussed at length the patient. Tiny options were discussed with patient who plan to proceed to his earliest convenience. His questions concerns are addressed.  Patient Education:Alternative treatments to surgery were discussed with patient (and family).  Risks and benefits  of procedure were fully explained to the patient (and family) who gave informed consent. Patient/family questions were addressed.  Follow-up:Pending Surgery

## 2012-08-18 NOTE — Progress Notes (Signed)
From OR. [place]. Moaning, groaning. Pain med per anesthesia.

## 2012-08-18 NOTE — Preoperative (Signed)
Beta Blockers   Reason not to administer Beta Blockers:Not Applicable 

## 2012-08-18 NOTE — Addendum Note (Signed)
Addendum  created 08/18/12 1336 by Marolyn Hammock, CRNA   Modules edited:Charges VN

## 2012-08-18 NOTE — Progress Notes (Signed)
Awake. Continues moaning/groaning. C/O postop abd pain. Rates pain 10. Med as noted.

## 2012-08-18 NOTE — Progress Notes (Signed)
Awake. Continues c/o postop abd pain and shoulder pain. Rates pain 10. Sleeping at intervals. Dr Jayme Cloud notified. No new orders given.

## 2012-08-18 NOTE — Anesthesia Preprocedure Evaluation (Signed)
Anesthesia Evaluation  Patient identified by MRN, date of birth, ID band Patient awake    Reviewed: Allergy & Precautions, H&P , NPO status , Patient's Chart, lab work & pertinent test results  Airway Mallampati: II TM Distance: >3 FB     Dental  (+) Teeth Intact   Pulmonary asthma , sleep apnea , Current Smoker,  breath sounds clear to auscultation        Cardiovascular hypertension, Rhythm:Regular Rate:Normal     Neuro/Psych    GI/Hepatic   Endo/Other  Morbid obesity  Renal/GU      Musculoskeletal   Abdominal   Peds  Hematology   Anesthesia Other Findings   Reproductive/Obstetrics                           Anesthesia Physical Anesthesia Plan  ASA: II  Anesthesia Plan: General   Post-op Pain Management:    Induction: Intravenous  Airway Management Planned: Oral ETT  Additional Equipment:   Intra-op Plan:   Post-operative Plan: Extubation in OR  Informed Consent: I have reviewed the patients History and Physical, chart, labs and discussed the procedure including the risks, benefits and alternatives for the proposed anesthesia with the patient or authorized representative who has indicated his/her understanding and acceptance.     Plan Discussed with:   Anesthesia Plan Comments:         Anesthesia Quick Evaluation

## 2012-08-20 ENCOUNTER — Encounter (HOSPITAL_COMMUNITY): Payer: Self-pay | Admitting: General Surgery

## 2013-05-28 ENCOUNTER — Other Ambulatory Visit (HOSPITAL_COMMUNITY): Payer: Self-pay | Admitting: Family Medicine

## 2013-05-28 ENCOUNTER — Ambulatory Visit (HOSPITAL_COMMUNITY)
Admission: RE | Admit: 2013-05-28 | Discharge: 2013-05-28 | Disposition: A | Discharge status: Home / Self Care | Payer: BC Managed Care – PPO | Source: Ambulatory Visit | Attending: Family Medicine | Admitting: Family Medicine

## 2013-05-28 DIAGNOSIS — M79604 Pain in right leg: Secondary | ICD-10-CM

## 2013-05-28 DIAGNOSIS — R609 Edema, unspecified: Secondary | ICD-10-CM

## 2013-05-28 DIAGNOSIS — M79609 Pain in unspecified limb: Secondary | ICD-10-CM | POA: Insufficient documentation

## 2013-09-21 IMAGING — US US ABDOMEN LIMITED
1 series · 14 of 25 positions shown · non-contrast
Comparison: 01/18/2007

CLINICAL DATA: Severe abdominal pain

GALLBLADDER ULTRASOUND

[Series 1: us abdomen limited · 0.26mm/px · 14 of 55 slices shown]
[im 1/55]
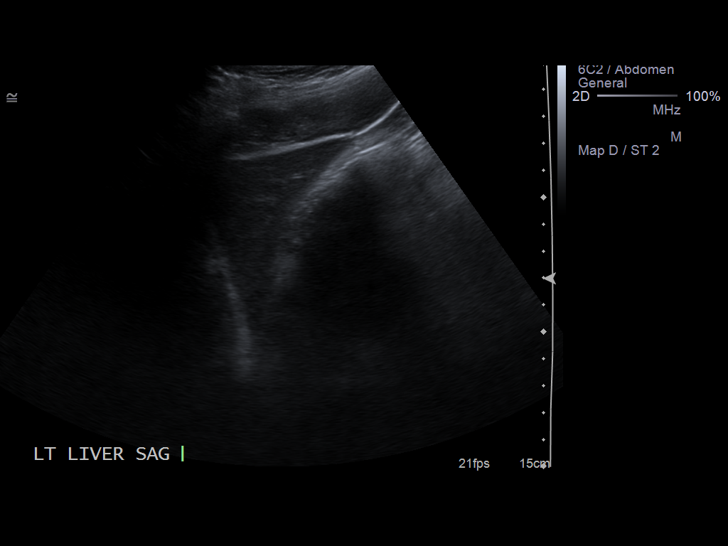
[im 5/55]
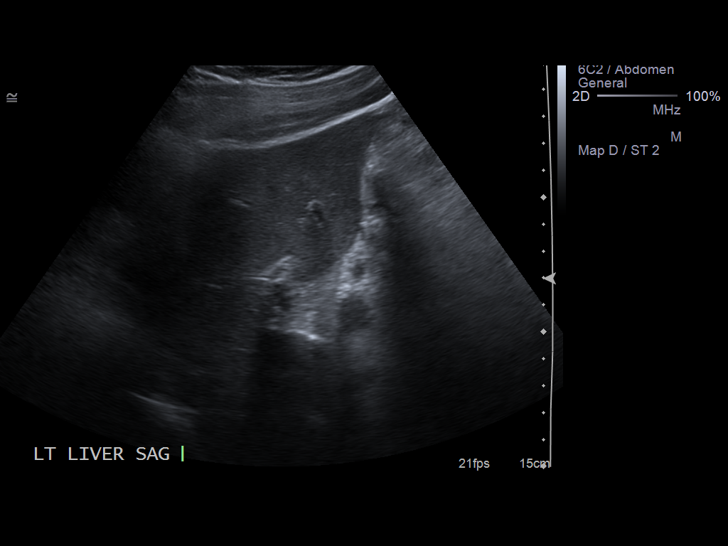
[im 10/55]
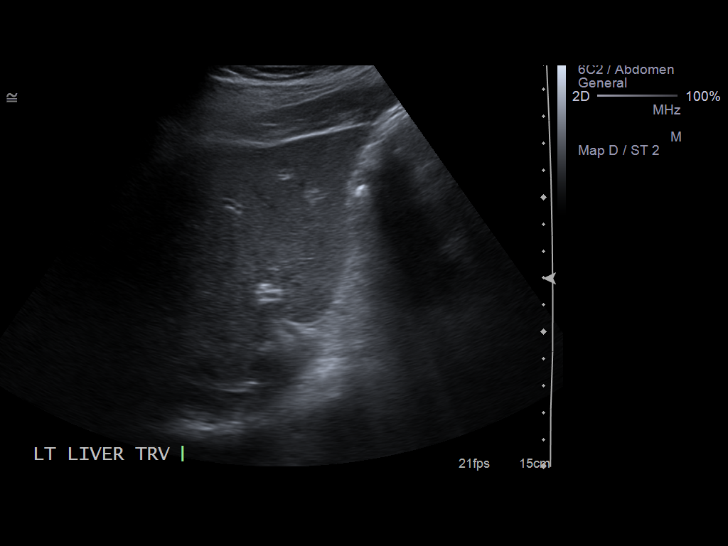
[im 14/55]
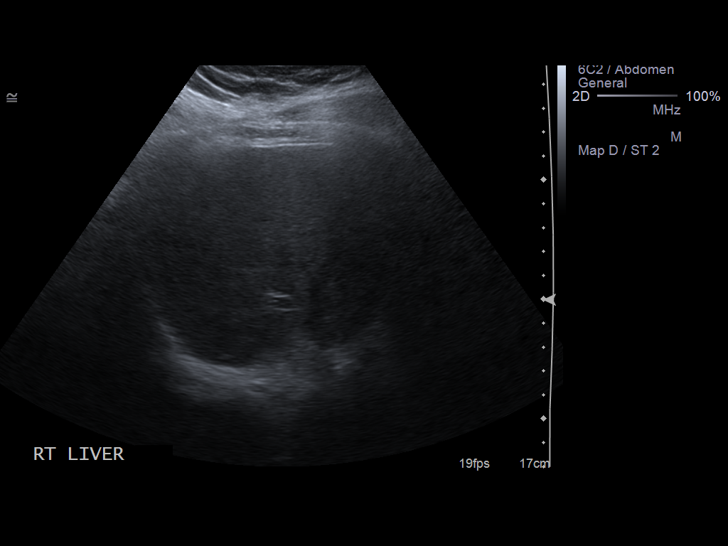
[im 19/55]
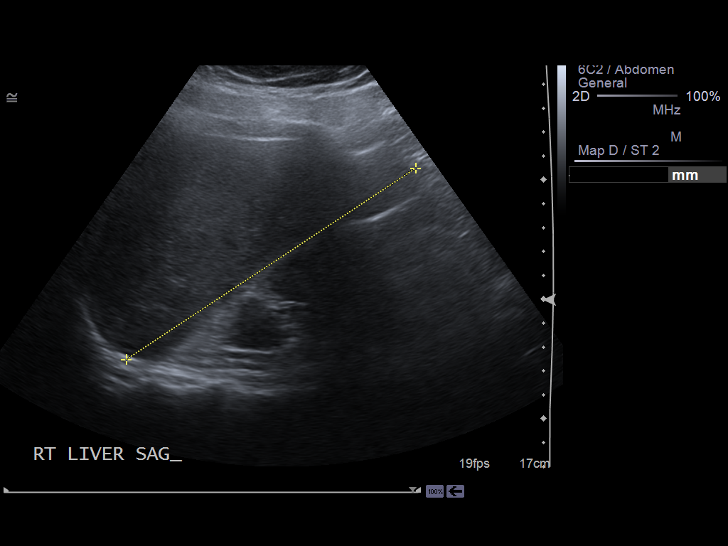
[im 21/55]
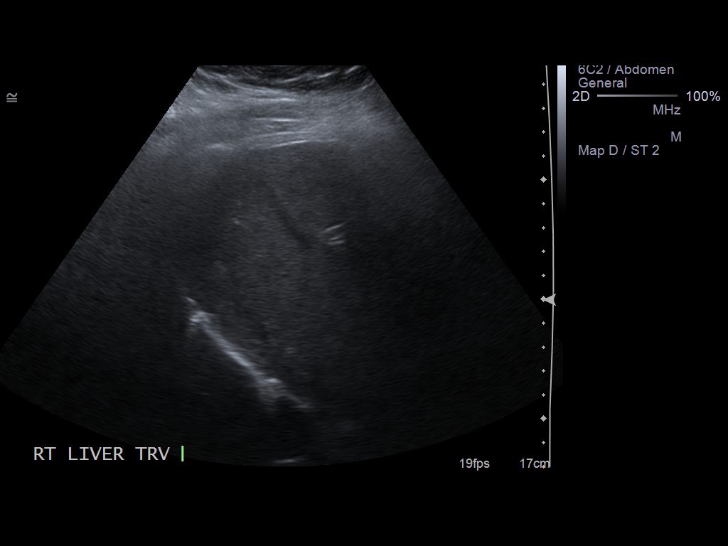
[im 25/55]
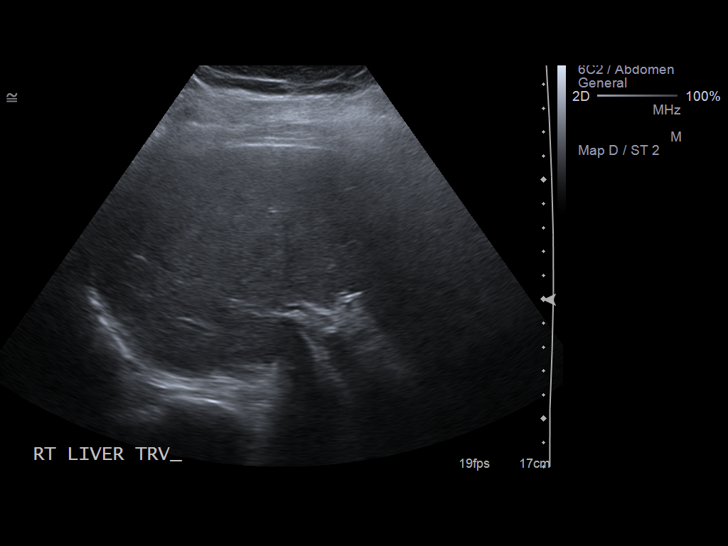
[im 30/55]
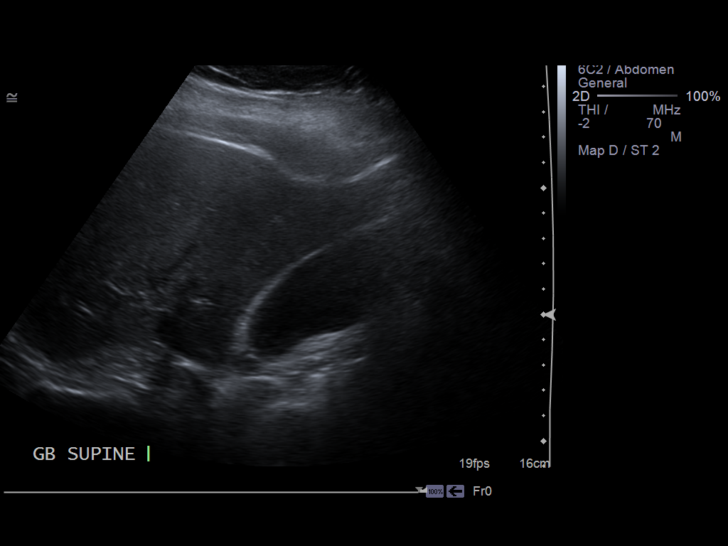
[im 34/55]
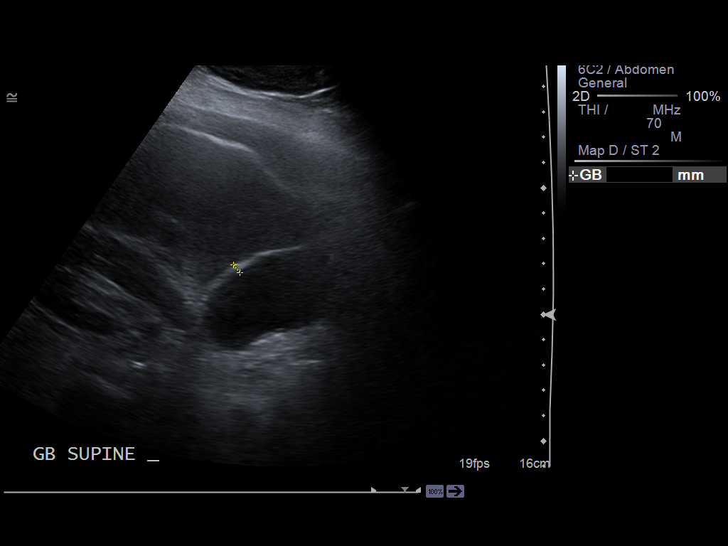
[im 37/55]
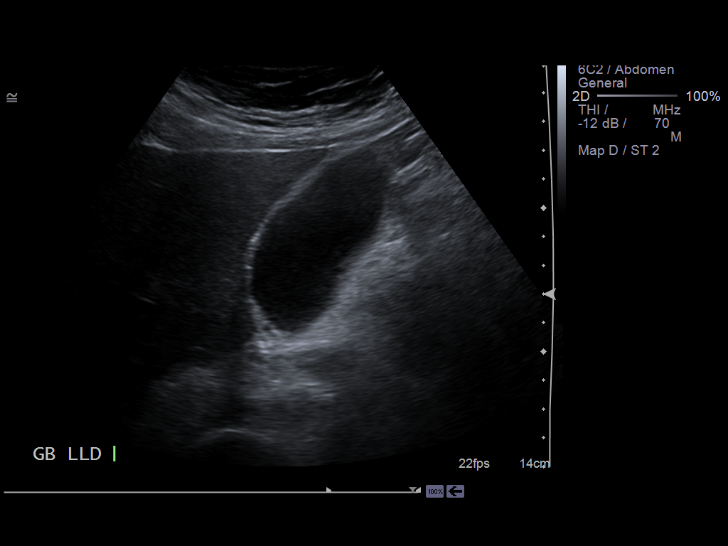
[im 41/55]
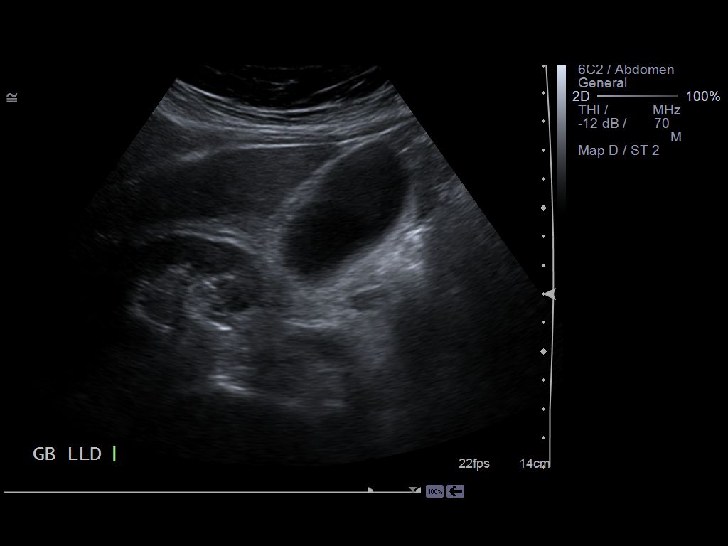
[im 46/55]
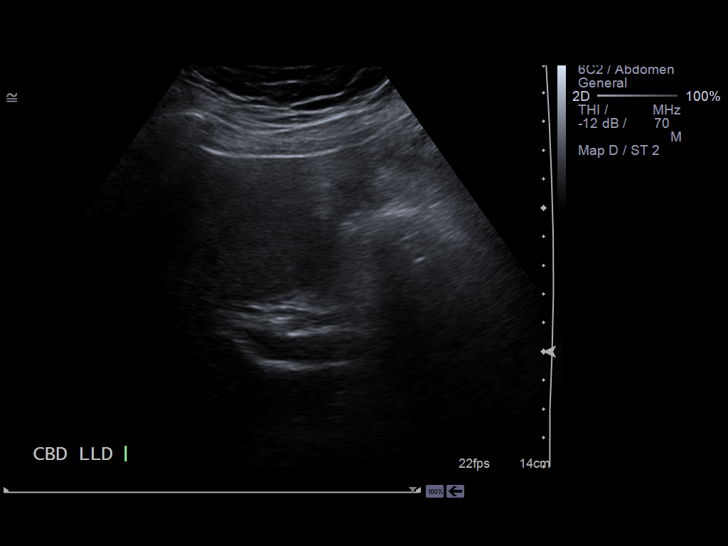
[im 50/55]
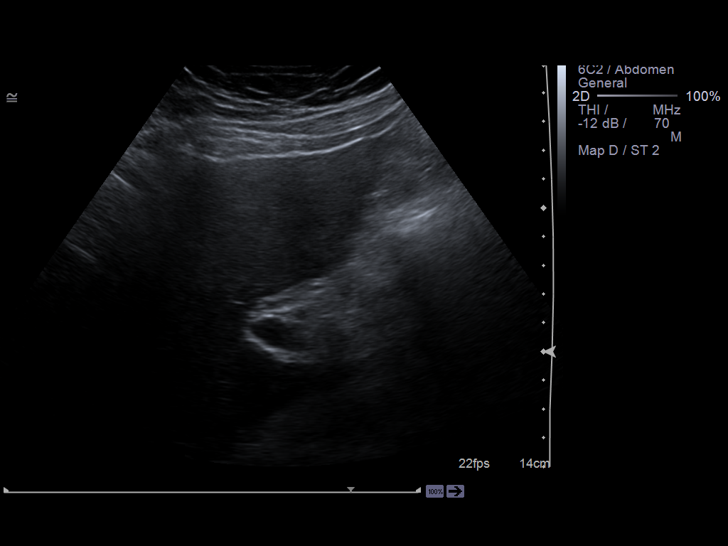
[im 55/55]
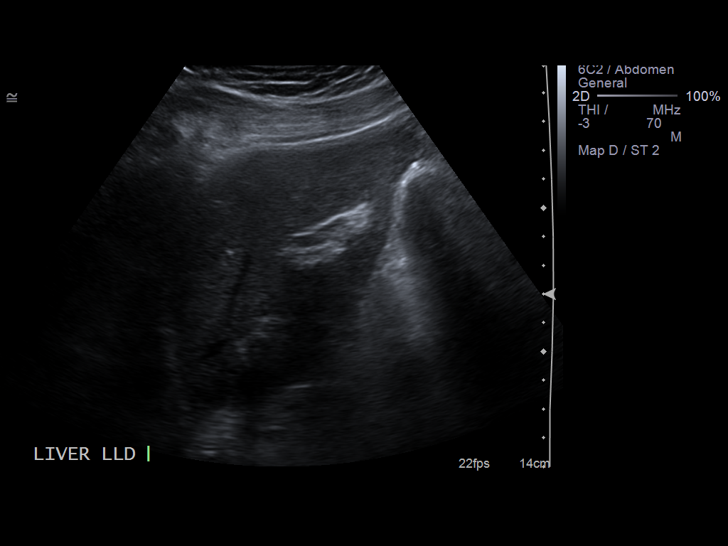

[14 of 25 positions shown; findings below may reference images not displayed]

FINDINGS: Gallbladder:  Gallbladder wall is thickened measuring 4 mm.  No
sludge or stones identified.  Negative sonographic Murphy's sign.

Common Bile Duct:  Prominent measuring 7.10 mm.

Liver:  Appears echogenic suggesting fatty infiltration.
IMPRESSION: 1.  Increased gallbladder wall thickness which may be due to
cholecystitis.  If there is a concern for cystic duct obstruction
then a nuclear medicine hepatobiliary scan would be advised.

## 2014-06-09 ENCOUNTER — Encounter: Payer: Self-pay | Admitting: Internal Medicine

## 2015-05-03 DIAGNOSIS — I10 Essential (primary) hypertension: Secondary | ICD-10-CM | POA: Diagnosis not present

## 2015-05-03 DIAGNOSIS — J441 Chronic obstructive pulmonary disease with (acute) exacerbation: Secondary | ICD-10-CM | POA: Diagnosis not present

## 2015-05-23 DIAGNOSIS — D485 Neoplasm of uncertain behavior of skin: Secondary | ICD-10-CM | POA: Diagnosis not present

## 2015-05-23 DIAGNOSIS — B079 Viral wart, unspecified: Secondary | ICD-10-CM | POA: Diagnosis not present

## 2015-05-23 DIAGNOSIS — L821 Other seborrheic keratosis: Secondary | ICD-10-CM | POA: Diagnosis not present

## 2015-06-21 DIAGNOSIS — I10 Essential (primary) hypertension: Secondary | ICD-10-CM | POA: Diagnosis not present

## 2015-06-21 DIAGNOSIS — Z6834 Body mass index (BMI) 34.0-34.9, adult: Secondary | ICD-10-CM | POA: Diagnosis not present

## 2015-06-21 DIAGNOSIS — R109 Unspecified abdominal pain: Secondary | ICD-10-CM | POA: Diagnosis not present

## 2015-06-21 DIAGNOSIS — N429 Disorder of prostate, unspecified: Secondary | ICD-10-CM | POA: Diagnosis not present

## 2015-06-21 DIAGNOSIS — R1030 Lower abdominal pain, unspecified: Secondary | ICD-10-CM | POA: Diagnosis not present

## 2015-06-29 DIAGNOSIS — Z6833 Body mass index (BMI) 33.0-33.9, adult: Secondary | ICD-10-CM | POA: Diagnosis not present

## 2015-06-29 DIAGNOSIS — R1013 Epigastric pain: Secondary | ICD-10-CM | POA: Diagnosis not present

## 2015-06-30 DIAGNOSIS — K859 Acute pancreatitis without necrosis or infection, unspecified: Secondary | ICD-10-CM | POA: Diagnosis not present

## 2015-06-30 DIAGNOSIS — K8689 Other specified diseases of pancreas: Secondary | ICD-10-CM | POA: Diagnosis not present

## 2015-07-12 DIAGNOSIS — D489 Neoplasm of uncertain behavior, unspecified: Secondary | ICD-10-CM | POA: Diagnosis not present

## 2015-07-12 DIAGNOSIS — S30860A Insect bite (nonvenomous) of lower back and pelvis, initial encounter: Secondary | ICD-10-CM | POA: Diagnosis not present

## 2015-07-26 DIAGNOSIS — Z5189 Encounter for other specified aftercare: Secondary | ICD-10-CM | POA: Diagnosis not present

## 2015-08-04 DIAGNOSIS — H2513 Age-related nuclear cataract, bilateral: Secondary | ICD-10-CM | POA: Diagnosis not present

## 2015-09-06 DIAGNOSIS — I10 Essential (primary) hypertension: Secondary | ICD-10-CM | POA: Diagnosis not present

## 2015-09-06 DIAGNOSIS — J069 Acute upper respiratory infection, unspecified: Secondary | ICD-10-CM | POA: Diagnosis not present

## 2015-09-06 DIAGNOSIS — J9801 Acute bronchospasm: Secondary | ICD-10-CM | POA: Diagnosis not present

## 2015-11-01 DIAGNOSIS — R05 Cough: Secondary | ICD-10-CM | POA: Diagnosis not present

## 2015-11-01 DIAGNOSIS — J9801 Acute bronchospasm: Secondary | ICD-10-CM | POA: Diagnosis not present

## 2015-11-01 DIAGNOSIS — J449 Chronic obstructive pulmonary disease, unspecified: Secondary | ICD-10-CM | POA: Diagnosis not present

## 2015-11-08 DIAGNOSIS — J449 Chronic obstructive pulmonary disease, unspecified: Secondary | ICD-10-CM | POA: Diagnosis not present

## 2015-12-13 DIAGNOSIS — J449 Chronic obstructive pulmonary disease, unspecified: Secondary | ICD-10-CM | POA: Diagnosis not present

## 2015-12-13 DIAGNOSIS — I10 Essential (primary) hypertension: Secondary | ICD-10-CM | POA: Diagnosis not present

## 2016-03-13 DIAGNOSIS — J449 Chronic obstructive pulmonary disease, unspecified: Secondary | ICD-10-CM | POA: Diagnosis not present

## 2016-03-13 DIAGNOSIS — I10 Essential (primary) hypertension: Secondary | ICD-10-CM | POA: Diagnosis not present

## 2016-03-13 DIAGNOSIS — Z6833 Body mass index (BMI) 33.0-33.9, adult: Secondary | ICD-10-CM | POA: Diagnosis not present

## 2016-05-08 DIAGNOSIS — J449 Chronic obstructive pulmonary disease, unspecified: Secondary | ICD-10-CM | POA: Diagnosis not present

## 2016-05-08 DIAGNOSIS — Z23 Encounter for immunization: Secondary | ICD-10-CM | POA: Diagnosis not present

## 2016-05-08 DIAGNOSIS — I10 Essential (primary) hypertension: Secondary | ICD-10-CM | POA: Diagnosis not present

## 2016-05-08 DIAGNOSIS — J9801 Acute bronchospasm: Secondary | ICD-10-CM | POA: Diagnosis not present

## 2016-08-07 DIAGNOSIS — E669 Obesity, unspecified: Secondary | ICD-10-CM | POA: Diagnosis not present

## 2016-08-07 DIAGNOSIS — H2513 Age-related nuclear cataract, bilateral: Secondary | ICD-10-CM | POA: Diagnosis not present

## 2016-08-07 DIAGNOSIS — E785 Hyperlipidemia, unspecified: Secondary | ICD-10-CM | POA: Diagnosis not present

## 2016-08-07 DIAGNOSIS — I10 Essential (primary) hypertension: Secondary | ICD-10-CM | POA: Diagnosis not present

## 2016-08-07 DIAGNOSIS — J449 Chronic obstructive pulmonary disease, unspecified: Secondary | ICD-10-CM | POA: Diagnosis not present

## 2016-09-11 DIAGNOSIS — I10 Essential (primary) hypertension: Secondary | ICD-10-CM | POA: Diagnosis not present

## 2016-09-11 DIAGNOSIS — J449 Chronic obstructive pulmonary disease, unspecified: Secondary | ICD-10-CM | POA: Diagnosis not present

## 2016-09-17 DIAGNOSIS — I10 Essential (primary) hypertension: Secondary | ICD-10-CM | POA: Diagnosis not present

## 2016-09-17 DIAGNOSIS — Z6835 Body mass index (BMI) 35.0-35.9, adult: Secondary | ICD-10-CM | POA: Diagnosis not present

## 2016-09-17 DIAGNOSIS — J309 Allergic rhinitis, unspecified: Secondary | ICD-10-CM | POA: Diagnosis not present

## 2016-09-17 DIAGNOSIS — Z0000 Encounter for general adult medical examination without abnormal findings: Secondary | ICD-10-CM | POA: Diagnosis not present

## 2016-09-17 DIAGNOSIS — E785 Hyperlipidemia, unspecified: Secondary | ICD-10-CM | POA: Diagnosis not present

## 2016-09-17 DIAGNOSIS — J449 Chronic obstructive pulmonary disease, unspecified: Secondary | ICD-10-CM | POA: Diagnosis not present

## 2016-09-17 DIAGNOSIS — R69 Illness, unspecified: Secondary | ICD-10-CM | POA: Diagnosis not present

## 2016-09-17 DIAGNOSIS — E669 Obesity, unspecified: Secondary | ICD-10-CM | POA: Diagnosis not present

## 2016-10-16 DIAGNOSIS — I10 Essential (primary) hypertension: Secondary | ICD-10-CM | POA: Diagnosis not present

## 2016-10-16 DIAGNOSIS — J9801 Acute bronchospasm: Secondary | ICD-10-CM | POA: Diagnosis not present

## 2016-10-16 DIAGNOSIS — J069 Acute upper respiratory infection, unspecified: Secondary | ICD-10-CM | POA: Diagnosis not present

## 2016-10-30 DIAGNOSIS — J9801 Acute bronchospasm: Secondary | ICD-10-CM | POA: Diagnosis not present

## 2016-10-30 DIAGNOSIS — J069 Acute upper respiratory infection, unspecified: Secondary | ICD-10-CM | POA: Diagnosis not present

## 2016-10-30 DIAGNOSIS — J040 Acute laryngitis: Secondary | ICD-10-CM | POA: Diagnosis not present

## 2017-01-22 DIAGNOSIS — G4733 Obstructive sleep apnea (adult) (pediatric): Secondary | ICD-10-CM | POA: Diagnosis not present

## 2017-01-22 DIAGNOSIS — E785 Hyperlipidemia, unspecified: Secondary | ICD-10-CM | POA: Diagnosis not present

## 2017-01-22 DIAGNOSIS — I10 Essential (primary) hypertension: Secondary | ICD-10-CM | POA: Diagnosis not present

## 2017-01-22 DIAGNOSIS — J449 Chronic obstructive pulmonary disease, unspecified: Secondary | ICD-10-CM | POA: Diagnosis not present

## 2017-02-26 DIAGNOSIS — I10 Essential (primary) hypertension: Secondary | ICD-10-CM | POA: Diagnosis not present

## 2017-02-26 DIAGNOSIS — J449 Chronic obstructive pulmonary disease, unspecified: Secondary | ICD-10-CM | POA: Diagnosis not present

## 2017-05-28 DIAGNOSIS — I10 Essential (primary) hypertension: Secondary | ICD-10-CM | POA: Diagnosis not present

## 2017-05-28 DIAGNOSIS — J449 Chronic obstructive pulmonary disease, unspecified: Secondary | ICD-10-CM | POA: Diagnosis not present

## 2017-05-28 DIAGNOSIS — M109 Gout, unspecified: Secondary | ICD-10-CM | POA: Diagnosis not present

## 2017-08-07 DIAGNOSIS — H31093 Other chorioretinal scars, bilateral: Secondary | ICD-10-CM | POA: Diagnosis not present

## 2017-08-07 DIAGNOSIS — H2513 Age-related nuclear cataract, bilateral: Secondary | ICD-10-CM | POA: Diagnosis not present

## 2017-08-22 DIAGNOSIS — H2511 Age-related nuclear cataract, right eye: Secondary | ICD-10-CM | POA: Diagnosis not present

## 2017-08-26 DIAGNOSIS — E669 Obesity, unspecified: Secondary | ICD-10-CM | POA: Diagnosis not present

## 2017-08-26 DIAGNOSIS — I10 Essential (primary) hypertension: Secondary | ICD-10-CM | POA: Diagnosis not present

## 2017-08-26 DIAGNOSIS — J449 Chronic obstructive pulmonary disease, unspecified: Secondary | ICD-10-CM | POA: Diagnosis not present

## 2017-09-17 DIAGNOSIS — R69 Illness, unspecified: Secondary | ICD-10-CM | POA: Diagnosis not present

## 2017-09-17 DIAGNOSIS — Z833 Family history of diabetes mellitus: Secondary | ICD-10-CM | POA: Diagnosis not present

## 2017-09-17 DIAGNOSIS — Z8249 Family history of ischemic heart disease and other diseases of the circulatory system: Secondary | ICD-10-CM | POA: Diagnosis not present

## 2017-09-17 DIAGNOSIS — J449 Chronic obstructive pulmonary disease, unspecified: Secondary | ICD-10-CM | POA: Diagnosis not present

## 2017-09-17 DIAGNOSIS — E785 Hyperlipidemia, unspecified: Secondary | ICD-10-CM | POA: Diagnosis not present

## 2017-09-17 DIAGNOSIS — Z803 Family history of malignant neoplasm of breast: Secondary | ICD-10-CM | POA: Diagnosis not present

## 2017-09-17 DIAGNOSIS — Z823 Family history of stroke: Secondary | ICD-10-CM | POA: Diagnosis not present

## 2017-09-17 DIAGNOSIS — I10 Essential (primary) hypertension: Secondary | ICD-10-CM | POA: Diagnosis not present

## 2017-09-22 DIAGNOSIS — H2512 Age-related nuclear cataract, left eye: Secondary | ICD-10-CM | POA: Diagnosis not present

## 2017-09-26 DIAGNOSIS — H2512 Age-related nuclear cataract, left eye: Secondary | ICD-10-CM | POA: Diagnosis not present

## 2017-11-25 DIAGNOSIS — J449 Chronic obstructive pulmonary disease, unspecified: Secondary | ICD-10-CM | POA: Diagnosis not present

## 2017-11-25 DIAGNOSIS — I10 Essential (primary) hypertension: Secondary | ICD-10-CM | POA: Diagnosis not present

## 2018-02-25 DIAGNOSIS — J449 Chronic obstructive pulmonary disease, unspecified: Secondary | ICD-10-CM | POA: Diagnosis not present

## 2018-02-25 DIAGNOSIS — I10 Essential (primary) hypertension: Secondary | ICD-10-CM | POA: Diagnosis not present

## 2018-02-25 DIAGNOSIS — N529 Male erectile dysfunction, unspecified: Secondary | ICD-10-CM | POA: Diagnosis not present

## 2018-05-27 DIAGNOSIS — J9801 Acute bronchospasm: Secondary | ICD-10-CM | POA: Diagnosis not present

## 2018-05-27 DIAGNOSIS — Z6834 Body mass index (BMI) 34.0-34.9, adult: Secondary | ICD-10-CM | POA: Diagnosis not present

## 2018-05-27 DIAGNOSIS — E785 Hyperlipidemia, unspecified: Secondary | ICD-10-CM | POA: Diagnosis not present

## 2018-05-27 DIAGNOSIS — J449 Chronic obstructive pulmonary disease, unspecified: Secondary | ICD-10-CM | POA: Diagnosis not present

## 2018-05-27 DIAGNOSIS — I10 Essential (primary) hypertension: Secondary | ICD-10-CM | POA: Diagnosis not present

## 2018-06-03 DIAGNOSIS — H31093 Other chorioretinal scars, bilateral: Secondary | ICD-10-CM | POA: Diagnosis not present

## 2018-06-03 DIAGNOSIS — H2513 Age-related nuclear cataract, bilateral: Secondary | ICD-10-CM | POA: Diagnosis not present

## 2018-06-03 DIAGNOSIS — Z961 Presence of intraocular lens: Secondary | ICD-10-CM | POA: Diagnosis not present

## 2018-07-01 DIAGNOSIS — R69 Illness, unspecified: Secondary | ICD-10-CM | POA: Diagnosis not present

## 2018-07-01 DIAGNOSIS — Z6834 Body mass index (BMI) 34.0-34.9, adult: Secondary | ICD-10-CM | POA: Diagnosis not present

## 2018-07-01 DIAGNOSIS — I10 Essential (primary) hypertension: Secondary | ICD-10-CM | POA: Diagnosis not present

## 2018-08-18 DIAGNOSIS — Z720 Tobacco use: Secondary | ICD-10-CM | POA: Diagnosis not present

## 2018-08-18 DIAGNOSIS — I10 Essential (primary) hypertension: Secondary | ICD-10-CM | POA: Diagnosis not present

## 2018-08-18 DIAGNOSIS — Z125 Encounter for screening for malignant neoplasm of prostate: Secondary | ICD-10-CM | POA: Diagnosis not present

## 2018-08-18 DIAGNOSIS — Z0000 Encounter for general adult medical examination without abnormal findings: Secondary | ICD-10-CM | POA: Diagnosis not present

## 2018-08-18 DIAGNOSIS — N400 Enlarged prostate without lower urinary tract symptoms: Secondary | ICD-10-CM | POA: Diagnosis not present

## 2018-12-15 DIAGNOSIS — J449 Chronic obstructive pulmonary disease, unspecified: Secondary | ICD-10-CM | POA: Diagnosis not present

## 2018-12-15 DIAGNOSIS — Z720 Tobacco use: Secondary | ICD-10-CM | POA: Diagnosis not present

## 2018-12-15 DIAGNOSIS — I10 Essential (primary) hypertension: Secondary | ICD-10-CM | POA: Diagnosis not present

## 2019-01-20 DIAGNOSIS — Z0130 Encounter for examination of blood pressure without abnormal findings: Secondary | ICD-10-CM | POA: Diagnosis not present

## 2019-03-17 DIAGNOSIS — J441 Chronic obstructive pulmonary disease with (acute) exacerbation: Secondary | ICD-10-CM | POA: Diagnosis not present

## 2019-03-17 DIAGNOSIS — I10 Essential (primary) hypertension: Secondary | ICD-10-CM | POA: Diagnosis not present

## 2019-03-17 DIAGNOSIS — J9801 Acute bronchospasm: Secondary | ICD-10-CM | POA: Diagnosis not present

## 2019-05-11 DIAGNOSIS — J441 Chronic obstructive pulmonary disease with (acute) exacerbation: Secondary | ICD-10-CM | POA: Diagnosis not present

## 2019-05-11 DIAGNOSIS — J9801 Acute bronchospasm: Secondary | ICD-10-CM | POA: Diagnosis not present

## 2019-05-11 DIAGNOSIS — Z720 Tobacco use: Secondary | ICD-10-CM | POA: Diagnosis not present

## 2019-05-11 DIAGNOSIS — Z7189 Other specified counseling: Secondary | ICD-10-CM | POA: Diagnosis not present

## 2019-06-04 DIAGNOSIS — R69 Illness, unspecified: Secondary | ICD-10-CM | POA: Diagnosis not present

## 2019-06-04 DIAGNOSIS — Z823 Family history of stroke: Secondary | ICD-10-CM | POA: Diagnosis not present

## 2019-06-04 DIAGNOSIS — Z803 Family history of malignant neoplasm of breast: Secondary | ICD-10-CM | POA: Diagnosis not present

## 2019-06-04 DIAGNOSIS — Z309 Encounter for contraceptive management, unspecified: Secondary | ICD-10-CM | POA: Diagnosis not present

## 2019-06-04 DIAGNOSIS — Z8249 Family history of ischemic heart disease and other diseases of the circulatory system: Secondary | ICD-10-CM | POA: Diagnosis not present

## 2019-06-04 DIAGNOSIS — J309 Allergic rhinitis, unspecified: Secondary | ICD-10-CM | POA: Diagnosis not present

## 2019-06-04 DIAGNOSIS — J449 Chronic obstructive pulmonary disease, unspecified: Secondary | ICD-10-CM | POA: Diagnosis not present

## 2019-06-04 DIAGNOSIS — I10 Essential (primary) hypertension: Secondary | ICD-10-CM | POA: Diagnosis not present

## 2019-06-04 DIAGNOSIS — N529 Male erectile dysfunction, unspecified: Secondary | ICD-10-CM | POA: Diagnosis not present

## 2019-07-21 DIAGNOSIS — B394 Histoplasmosis capsulati, unspecified: Secondary | ICD-10-CM | POA: Diagnosis not present

## 2019-07-21 DIAGNOSIS — J9801 Acute bronchospasm: Secondary | ICD-10-CM | POA: Diagnosis not present

## 2019-07-21 DIAGNOSIS — H31013 Macula scars of posterior pole (postinflammatory) (post-traumatic), bilateral: Secondary | ICD-10-CM | POA: Diagnosis not present

## 2019-07-21 DIAGNOSIS — Z961 Presence of intraocular lens: Secondary | ICD-10-CM | POA: Diagnosis not present

## 2019-07-21 DIAGNOSIS — J441 Chronic obstructive pulmonary disease with (acute) exacerbation: Secondary | ICD-10-CM | POA: Diagnosis not present

## 2019-07-31 DIAGNOSIS — I82409 Acute embolism and thrombosis of unspecified deep veins of unspecified lower extremity: Secondary | ICD-10-CM

## 2019-07-31 HISTORY — DX: Acute embolism and thrombosis of unspecified deep veins of unspecified lower extremity: I82.409

## 2019-08-04 DIAGNOSIS — Z720 Tobacco use: Secondary | ICD-10-CM | POA: Diagnosis not present

## 2019-08-04 DIAGNOSIS — J441 Chronic obstructive pulmonary disease with (acute) exacerbation: Secondary | ICD-10-CM | POA: Diagnosis not present

## 2019-08-04 DIAGNOSIS — I10 Essential (primary) hypertension: Secondary | ICD-10-CM | POA: Diagnosis not present

## 2019-09-13 ENCOUNTER — Other Ambulatory Visit: Payer: Self-pay

## 2019-09-13 ENCOUNTER — Ambulatory Visit: Payer: Medicare HMO | Attending: Internal Medicine

## 2019-09-13 DIAGNOSIS — Z23 Encounter for immunization: Secondary | ICD-10-CM | POA: Insufficient documentation

## 2019-09-13 NOTE — Progress Notes (Signed)
   Covid-19 Vaccination Clinic  Name:  George Mays    MRN: [id number] DOB: 1951-01-03  09/13/2019  Mr. George Mays was observed post Covid-19 immunization for 15 minutes without incidence. He was provided with Vaccine Information Sheet and instruction to access the V-Safe system.   Mr. George Mays was instructed to call 911 with any severe reactions post vaccine: Marland Kitchen Difficulty breathing  . Swelling of your face and throat  . A fast heartbeat  . A bad rash all over your body  . Dizziness and weakness    Immunizations Administered    Name Date Dose VIS Date Route   Moderna COVID-19 Vaccine 09/13/2019  1:51 PM 0.5 mL 06/30/2019 Intramuscular   Manufacturer: Moderna   Lot: [id number]   [id number]

## 2019-10-11 ENCOUNTER — Ambulatory Visit: Payer: Medicare HMO | Attending: Internal Medicine

## 2019-10-11 DIAGNOSIS — Z23 Encounter for immunization: Secondary | ICD-10-CM

## 2019-10-11 NOTE — Progress Notes (Signed)
   Covid-19 Vaccination Clinic  Name:  George Mays    MRN: [id number] DOB: [birth year]  10/11/2019  [name] was observed post Covid-19 immunization for 15 minutes without incident. He was provided with Vaccine Information Sheet and instruction to access the V-Safe system.   [name] was instructed to call 911 with any severe reactions post vaccine: Marland Kitchen Difficulty breathing  . Swelling of face and throat  . A fast heartbeat  . A bad rash all over body  . Dizziness and weakness   Immunizations Administered    Name Date Dose VIS Date Route   Moderna COVID-19 Vaccine 10/11/2019  1:38 PM 0.5 mL 06/30/2019 Intramuscular   Manufacturer: Moderna   Lot: [id number]   [id number]

## 2019-10-23 ENCOUNTER — Emergency Department (HOSPITAL_COMMUNITY)
Admission: EM | Admit: 2019-10-23 | Discharge: 2019-10-23 | Disposition: A | Discharge status: Home / Self Care | Payer: Medicare HMO | Attending: Emergency Medicine | Admitting: Emergency Medicine

## 2019-10-23 ENCOUNTER — Other Ambulatory Visit: Payer: Self-pay

## 2019-10-23 ENCOUNTER — Ambulatory Visit
Admission: EM | Admit: 2019-10-23 | Discharge: 2019-10-23 | Disposition: A | Discharge status: Home / Self Care | Payer: Medicare HMO | Source: Home / Self Care

## 2019-10-23 ENCOUNTER — Emergency Department (HOSPITAL_COMMUNITY): Payer: Medicare HMO

## 2019-10-23 DIAGNOSIS — I824Y1 Acute embolism and thrombosis of unspecified deep veins of right proximal lower extremity: Secondary | ICD-10-CM | POA: Diagnosis not present

## 2019-10-23 DIAGNOSIS — I10 Essential (primary) hypertension: Secondary | ICD-10-CM | POA: Insufficient documentation

## 2019-10-23 DIAGNOSIS — Z79899 Other long term (current) drug therapy: Secondary | ICD-10-CM | POA: Diagnosis not present

## 2019-10-23 DIAGNOSIS — R69 Illness, unspecified: Secondary | ICD-10-CM | POA: Diagnosis not present

## 2019-10-23 DIAGNOSIS — F17210 Nicotine dependence, cigarettes, uncomplicated: Secondary | ICD-10-CM | POA: Diagnosis not present

## 2019-10-23 DIAGNOSIS — J45909 Unspecified asthma, uncomplicated: Secondary | ICD-10-CM | POA: Insufficient documentation

## 2019-10-23 DIAGNOSIS — I82432 Acute embolism and thrombosis of left popliteal vein: Secondary | ICD-10-CM | POA: Diagnosis not present

## 2019-10-23 DIAGNOSIS — I82412 Acute embolism and thrombosis of left femoral vein: Secondary | ICD-10-CM | POA: Diagnosis not present

## 2019-10-23 DIAGNOSIS — M79604 Pain in right leg: Secondary | ICD-10-CM | POA: Diagnosis present

## 2019-10-23 MED ORDER — RIVAROXABAN (XARELTO) VTE STARTER PACK (15 & 20 MG): 15.0000 mg | ORAL_TABLET | Freq: Two times a day (BID) | ORAL | 0 refills | Status: DC

## 2019-10-23 NOTE — ED Notes (Signed)
From US

## 2019-10-23 NOTE — ED Notes (Signed)
Mr George Mays has evaluated  Pt to [place] via [place]

## 2019-10-23 NOTE — ED Notes (Signed)
Pt sent from UC after complaint of calf pain and redness.  Mr George Mays in to eval

## 2019-10-23 NOTE — ED Triage Notes (Signed)
Seen at Urgent Care this am and sent to ED.  C/o right calf pain, rating pain 8/10.  Calf is red and swollen, tender to touch.

## 2019-10-23 NOTE — ED Provider Notes (Incomplete)
Shared service with APP.  I have personally seen and examined the patient, providing direct face to face care.  Physical exam findings and plan include patient presents with persistent right lower extremity swelling.  Patient has mild tenderness in the calf and swelling right lower extremity worse than left.  No fevers or chills.  No injuries.  No shortness of breath or history of blood clot no classic blood clot risk factors.  Ultrasound confirmed blood clot and plan for anticoagulant and close outpatient follow-up. ? ?No diagnosis found.  ? ?

## 2019-10-23 NOTE — Discharge Instructions (Addendum)
Please take your medications, as prescribed.  This medication makes you at risk for bleeds.  Follow-up with your primary care provider regarding today's encounter.  I also would like you to call the office of Dr. Scot Dock, vascular surgery, to schedule an appointment for ongoing evaluation and management.  Return to the ED or seek immediate medical attention for any new or worsening symptoms.

## 2019-10-23 NOTE — ED Provider Notes (Signed)
[facility] EMERGENCY DEPARTMENT Provider Note   CSN: [id number] Arrival date & time: 10/23/19  1218     History Chief Complaint  Patient presents with  . Leg Pain    George Mays is a 69 y.o. male with PMH significant for HTN who presents to the ED with a 4-day history of right lower extremity swelling, redness, warmth, and discomfort.  Patient was sent here from an urgent care with concern for DVT.  Patient rates his right calf discomfort as 7 out of 10 and worse with palpation.  However, he states that his well controlled with Tylenol.  He reports that he was on the course golfing when he first noticed his right calf swelling and discomfort.  Since then, he has failed to improve despite elevation and Tylenol regularly.  Last night he reports mild chills.  He denies any chest pain, shortness of breath or cough, pleuritic symptoms, fevers, diminished ROM or strength, numbness, or other symptoms.  No history of clots or known clotting disorder.  No recent surgeries or immobilizations.  HPI     Past Medical History:  Diagnosis Date  . Asthma   . Gallbladder disease   . Hypertension   . Sleep apnea    does not use c-pap    There are no problems to display for this patient.   Past Surgical History:  Procedure Laterality Date  . CHOLECYSTECTOMY  08/18/2012   Procedure: LAPAROSCOPIC CHOLECYSTECTOMY;  Surgeon: Donato Heinz, MD;  Location: [facility] ORS;  Service: General;  Laterality: N/A;       No family history on file.  Social History   Tobacco Use  . Smoking status: Current Every Day Smoker    Packs/day: 0.50    Years: 40.00    Pack years: 20.00    Types: Cigarettes  Substance Use Topics  . Alcohol use: No  . Drug use: No    Home Medications Prior to Admission medications   Medication Sig Start Date End Date Taking? Authorizing Provider  amLODipine-olmesartan (AZOR) 5-20 MG per tablet Take 1 tablet by mouth daily.    [provider]  cetirizine  (ZYRTEC) 10 MG tablet Take 10 mg by mouth daily.    [provider]  Fluticasone-Salmeterol (ADVAIR) 250-50 MCG/DOSE AEPB Inhale 1 puff into the lungs every 12 (twelve) hours.    [provider]  ondansetron (ZOFRAN) 8 MG tablet Take 1 tablet (8 mg total) by mouth every 8 (eight) hours as needed for nausea. 08/11/12   Ripley Fraise, MD  oxyCODONE-acetaminophen (PERCOCET/ROXICET) 5-325 MG per tablet Take 1-2 tablets by mouth every 4 (four) hours as needed for pain. 08/18/12   [place] Primus, MD  Rivaroxaban 15 & 20 MG TBPK Take 15 mg by mouth in the morning and at bedtime for 21 days. Follow package directions: Take one 15mg  tablet by mouth twice a day. On day 22, switch to one 20mg  tablet once a day. Take with food. 10/23/19 11/13/19  Corena Herter, PA-C    Allergies    Almond (diagnostic) and Aspirin  Review of Systems   Review of Systems  Constitutional: Positive for chills. Negative for fever.  Musculoskeletal: Negative for gait problem.  Skin: Positive for color change. Negative for wound.  Neurological: Negative for weakness and numbness.    Physical Exam Updated Vital Signs BP (!) 114/92 (BP Location: Right Arm)   Pulse 96   Temp 98.3 F (36.8 C) (Oral)   Resp 16   Ht 5\' 11"  (  1.803 m)   Wt 108.9 kg   SpO2 96%   BMI 33.47 kg/m   Physical Exam Vitals and nursing note reviewed. Exam conducted with a chaperone present.  Constitutional:      Appearance: Normal appearance.  HENT:     Head: Normocephalic and atraumatic.  Eyes:     General: No scleral icterus.    Conjunctiva/sclera: Conjunctivae normal.  Cardiovascular:     Rate and Rhythm: Normal rate and regular rhythm.     Pulses: Normal pulses.     Heart sounds: Normal heart sounds.  Pulmonary:     Effort: Pulmonary effort is normal. No respiratory distress.     Breath sounds: Normal breath sounds.  Musculoskeletal:     Cervical back: Normal range of motion. No rigidity.     Comments: Right lower  leg: Greater than 3 cm swelling when compared to left calf.  Erythema and mild TTP spanning entire calf.  Warmth appreciated.  Non weeping.  No popliteal or upper leg TTP or overlying skin changes.  Evidence of chronic venous stasis dermatitis bilaterally.  Pedal pulses and sensation intact.  ROM and strength intact throughout.  No bony TTP.  Skin:    General: Skin is dry.     Capillary Refill: Capillary refill takes less than 2 seconds.  Neurological:     Mental Status: He is alert and oriented to person, place, and time.     GCS: GCS eye subscore is 4. GCS verbal subscore is 5. GCS motor subscore is 6.     Gait: Gait normal.  Psychiatric:        Mood and Affect: Mood normal.        Behavior: Behavior normal.        Thought Content: Thought content normal.     ED Results / Procedures / Treatments   Labs (all labs ordered are listed, but only abnormal results are displayed) Labs Reviewed - No data to display  EKG None  Radiology US Venous Img Lower Right (DVT Study)  Result Date: 10/23/2019 CLINICAL DATA:  69 year old with leg swelling for 5 days. EXAM: LEFT LOWER EXTREMITY VENOUS DOPPLER ULTRASOUND TECHNIQUE: Gray-scale sonography with graded compression, as well as color Doppler and duplex ultrasound were performed to evaluate the lower extremity deep venous systems from the level of the common femoral vein and including the common femoral, femoral, profunda femoral, popliteal and calf veins including the posterior tibial, peroneal and gastrocnemius veins when visible. The superficial great saphenous vein was also interrogated. Spectral Doppler was utilized to evaluate flow at rest and with distal augmentation maneuvers in the common femoral, femoral and popliteal veins. COMPARISON:  05/28/2013 FINDINGS: Contralateral Common Femoral Vein: Respiratory phasicity is normal and symmetric with the symptomatic side. No evidence of thrombus. Normal compressibility. Common Femoral Vein: No  evidence of thrombus. Normal compressibility, respiratory phasicity and response to augmentation. Saphenofemoral Junction: No evidence of thrombus. Normal compressibility and flow on color Doppler imaging. Profunda Femoral Vein: No evidence of thrombus. Normal color Doppler flow. Femoral Vein: Positive for thrombus. Thrombus in the left femoral vein protrudes into the distal common femoral vein and origin of the profunda femoral vein. Thrombus throughout the right femoral vein. The femoral vein thrombus is expansile and occlusive. Popliteal Vein: Positive for thrombus. Occlusive thrombus in the right popliteal vein. Calf Veins: Positive for thrombus. Evidence for thrombus in posterior tibial and peroneal veins. Superficial Great Saphenous Vein: No evidence of thrombus. Normal compressibility. Other Findings:  None. IMPRESSION: Positive for  thrombus in the right lower extremity. Thrombus involves the right femoral vein, right popliteal vein and right deep calf veins. Thrombus in the femoral vein and popliteal vein is occlusive. In addition, the thrombus in the proximal femoral vein has a finger-like protrusion into the distal common femoral vein region. Electronically Signed   By: Markus Daft M.D.   On: 10/23/2019 14:05    Procedures Procedures (including critical care time)  Medications Ordered in ED Medications - No data to display  ED Course  I have reviewed the triage vital signs and the nursing notes.  Pertinent labs & imaging results that were available during my care of the patient were reviewed by me and considered in my medical decision making (see chart for details).    MDM Rules/Calculators/A&P                      DVT ultrasound study was positive for thrombus in right lower extremity involving right femoral vein, right popliteal vein, and right calf veins.  This is occlusive in the femoral and popliteal veins.  Fingerlike protrusion into the distal common femoral vein region.     Patient has never had a clot before and he has no known bleeding disorders.  Patient denies any renal impairment or history of CVA/TIA.  His physical exam is not concerning for phlegmasia cerulea dolens and any mild discomfort is well controlled on Tylenol.  Spoke with Dr. Scot Dock, vascular surgery, who reports that thrombolysis is only effective when it involves the external iliac veins and if I am particularly concerned can order venogram to evaluate for iliac vein involvement.  Otherwise, patient is fine for oral anticoagulation and outpatient follow-up in 2 to 3 days with the office of Dr. Scot Dock.  While [place] did not comment on iliac vein involvement, feel as though patient safe for discharge with anticoagulation.  Discussed with Dr. Adelfa Koh who saw patient and agrees with assessment and plan.  Provide patient with information on DVT.  Discussed risks associated with rivaroxaban.  Placed on the DVT pack.  Encouraging follow-up with vascular surgery in 2 to 3 days.  Also like for him to notify his primary care provider of today's encounter.  Patient adamantly denies any renal impairment or history of bleeds.  Strict ED return precautions discussed.  All of the evaluation and work-up results were discussed with the patient and any family at bedside. They were provided opportunity to ask any additional questions and have none at this time. They have expressed understanding of verbal discharge instructions as well as return precautions and are agreeable to the plan.    Final Clinical Impression(s) / ED Diagnoses Final diagnoses:  Acute deep vein thrombosis (DVT) of proximal vein of right lower extremity ([place])    Rx / DC Orders ED Discharge Orders         Ordered    Rivaroxaban 15 & 20 MG TBPK  2 times daily     10/23/19 1446           Reita Chard 10/23/19 1450    Elnora Morrison, MD 10/24/19 1556

## 2019-10-23 NOTE — ED Triage Notes (Signed)
Pt presents with c/o right leg pain and swelling, pain is left calf and is swollen and hot to touch. rovider made aware, pt encouraged to go to ED for higher level of care to rule out blood clot

## 2019-10-26 DIAGNOSIS — Z7901 Long term (current) use of anticoagulants: Secondary | ICD-10-CM | POA: Diagnosis not present

## 2019-10-26 DIAGNOSIS — J449 Chronic obstructive pulmonary disease, unspecified: Secondary | ICD-10-CM | POA: Diagnosis not present

## 2019-10-26 DIAGNOSIS — I82401 Acute embolism and thrombosis of unspecified deep veins of right lower extremity: Secondary | ICD-10-CM | POA: Diagnosis not present

## 2019-11-17 DIAGNOSIS — J449 Chronic obstructive pulmonary disease, unspecified: Secondary | ICD-10-CM | POA: Diagnosis not present

## 2019-11-17 DIAGNOSIS — I82411 Acute embolism and thrombosis of right femoral vein: Secondary | ICD-10-CM | POA: Diagnosis not present

## 2019-11-17 DIAGNOSIS — I872 Venous insufficiency (chronic) (peripheral): Secondary | ICD-10-CM | POA: Diagnosis not present

## 2019-11-19 ENCOUNTER — Telehealth (HOSPITAL_COMMUNITY): Payer: Self-pay

## 2019-11-19 NOTE — Telephone Encounter (Signed)

## 2019-11-20 ENCOUNTER — Ambulatory Visit (INDEPENDENT_AMBULATORY_CARE_PROVIDER_SITE_OTHER): Payer: Medicare HMO | Admitting: Physician Assistant

## 2019-11-20 ENCOUNTER — Other Ambulatory Visit: Payer: Self-pay

## 2019-11-20 VITALS — BP 121/74 | HR 77 | Resp 16 | Ht 71.5 in | Wt 247.8 lb

## 2019-11-20 DIAGNOSIS — I82431 Acute embolism and thrombosis of right popliteal vein: Secondary | ICD-10-CM

## 2019-11-20 NOTE — Progress Notes (Signed)
Office Note     CC:  follow up Requesting Provider:  Iona Beard, MD  HPI: George Mays is a 69 y.o. (1951-01-31) male who presents  With diagnosis of right lower extremity deep venous thrombosis.  He presented to the emergency department where he underwent left lower extremity venous Doppler ultrasound at [facility] emergency department.  He was found to have thrombus involving the right femoral vein, popliteal vein and deep calf veins.  Thrombus was noted to protrude into the distal common femoral vein.  There was no history of trauma, immobilization or history of cancer.  The patient is active with a morning walk every day and coughing on a regular basis.  He was prescribed rivaroxaban.  He states improvement in pain and swelling since treatment initiated.  He denies rest pain, shortness of breath ,fever, chills, claudication or rest pain.  He has a compressive stocking but has not been using this regularly.  He reports improvement in lower extremity edema in the morning but states edema returns after his morning walk.  The pt not on a statin for cholesterol management.  The pt not on a daily aspirin.   Other AC: Rivaroxaban The pt is on ACE inhibitor, CCB for hypertension.   The pt is not diabetic.   Tobacco hx: 1/2 packs/day x 40 years  Past Medical History:  Diagnosis Date  . Asthma   . Gallbladder disease   . Hypertension   . Sleep apnea    does not use c-pap    Past Surgical History:  Procedure Laterality Date  . CHOLECYSTECTOMY  08/18/2012   Procedure: LAPAROSCOPIC CHOLECYSTECTOMY;  Surgeon: Donato Heinz, MD;  Location: [facility];  Service: General;  Laterality: N/A;    Social History   Socioeconomic History  . Marital status: Legally Separated    Spouse name: Not on file  . Number of children: Not on file  . Years of education: Not on file  . Highest education level: Not on file  Occupational History  . Not on file  Tobacco Use  . Smoking status: Current  Every Day Smoker    Packs/day: 0.50    Years: 40.00    Pack years: 20.00    Types: Cigarettes  Substance and Sexual Activity  . Alcohol use: No  . Drug use: No  . Sexual activity: Yes    Birth control/protection: None  Other Topics Concern  . Not on file  Social History Narrative  . Not on file   Social Determinants of Health   Financial Resource Strain:   . Difficulty of Paying Living Expenses:   Food Insecurity:   . Worried About Charity fundraiser in the Last Year:   . Arboriculturist in the Last Year:   Transportation Needs:   . Film/video editor (Medical):   Marland Kitchen Lack of Transportation (Non-Medical):   Physical Activity:   . Days of Exercise per Week:   . Minutes of Exercise per Session:   Stress:   . Feeling of Stress :   Social Connections:   . Frequency of Communication with Friends and Family:   . Frequency of Social Gatherings with Friends and Family:   . Attends Religious Services:   . Active Member of Clubs or Organizations:   . Attends Archivist Meetings:   Marland Kitchen Marital Status:   Intimate Partner Violence:   . Fear of Current or Ex-Partner:   . Emotionally Abused:   Marland Kitchen Physically Abused:   .  Sexually Abused:    History reviewed. No pertinent family history.  Current Outpatient Medications  Medication Sig Dispense Refill  . amLODipine-olmesartan (AZOR) 5-20 MG per tablet Take 1 tablet by mouth daily.    . cetirizine (ZYRTEC) 10 MG tablet Take 10 mg by mouth daily.    . Fluticasone-Salmeterol (ADVAIR) 250-50 MCG/DOSE AEPB Inhale 1 puff into the lungs every 12 (twelve) hours.    . ondansetron (ZOFRAN) 8 MG tablet Take 1 tablet (8 mg total) by mouth every 8 (eight) hours as needed for nausea. 4 tablet 0  . oxyCODONE-acetaminophen (PERCOCET/ROXICET) 5-325 MG per tablet Take 1-2 tablets by mouth every 4 (four) hours as needed for pain. 45 tablet 0  . Rivaroxaban 15 & 20 MG TBPK Take 15 mg by mouth in the morning and at bedtime for 21 days. Follow  package directions: Take one 15mg  tablet by mouth twice a day. On day 22, switch to one 20mg  tablet once a day. Take with food. 51 each 0   No current facility-administered medications for this visit.    Allergies  Allergen Reactions  . Almond (Diagnostic)   . Aspirin     n/v     REVIEW OF SYSTEMS:   [X]  denotes positive finding, [ ]  denotes negative finding Cardiac  Comments:  Chest pain or chest pressure:    Shortness of breath upon exertion:    Short of breath when lying flat:    Irregular heart rhythm:        Vascular    Pain in calf, thigh, or hip brought on by ambulation:    Pain in feet at night that wakes you up from your sleep:     Blood clot in your veins: x   Leg swelling:  x       Pulmonary    Oxygen at home:    Productive cough:     Wheezing:         Neurologic    Sudden weakness in arms or legs:     Sudden numbness in arms or legs:     Sudden onset of difficulty speaking or slurred speech:    Temporary loss of vision in one eye:     Problems with dizziness:         Gastrointestinal    Blood in stool:     Vomited blood:         Genitourinary    Burning when urinating:     Blood in urine:        Psychiatric    Major depression:         Hematologic    Bleeding problems:    Problems with blood clotting too easily:        Skin    Rashes or ulcers:        Constitutional    Fever or chills:      PHYSICAL EXAMINATION:  Vitals:   11/20/19 0827  BP: 121/74  Pulse: 77  Resp: 16  SpO2: 96%  Weight: 247 lb 12.8 oz (112.4 kg)  Height: 5' 11.5" (1.816 m)    General:  WDWN in NAD; vital signs documented above Gait: No ataxia, walks unaided HENT: WNL, normocephalic Pulmonary: normal non-labored breathing  Cardiac: regular HR,  Skin: without rashes Extremities: without ischemic changes, without Gangrene , without cellulitis; without open wounds; the patient's right lower extremity is mildly edematous as compared to the left.  Nonpitting.   Minimal calf pain with right foot dorsiflexion.  He states  this is greatly improved.  2+ palpable dorsalis pedis pulses bilaterally Musculoskeletal: no muscle wasting or atrophy  Neurologic: A&O X 3;  No focal weakness or paresthesias are detected Psychiatric:  The pt has Normal affect.      Non-Invasive Vascular Imaging:   10/23/2019 IMPRESSION: Positive for thrombus in the right lower extremity. Thrombus involves the right femoral vein, right popliteal vein and right deep calf veins. Thrombus in the femoral vein and popliteal vein is occlusive. In addition, the thrombus in the proximal femoral vein has a finger-like protrusion into the distal common femoral vein region.   ASSESSMENT/PLAN:: 69 y.o. male here for follow up for right lower extremity DVT approximately 1 month since diagnosis.  He reports improvement in edema and pain. We discussed placing his compressive stocking on before he gets out of bed each morning.  Also reminded him about frequent elevation above the heart.  He verbalizes understanding of these this.  Discussed smoking cessation . Advised him to call our office for worsening swelling, pain or shortness of breath.  Follow-up as needed    Barbie Banner, PA-C [facility] [phone]  Clinic MD:   Donzetta Matters

## 2020-01-13 DIAGNOSIS — Z008 Encounter for other general examination: Secondary | ICD-10-CM | POA: Diagnosis not present

## 2020-01-13 DIAGNOSIS — Z6833 Body mass index (BMI) 33.0-33.9, adult: Secondary | ICD-10-CM | POA: Diagnosis not present

## 2020-01-13 DIAGNOSIS — Z886 Allergy status to analgesic agent status: Secondary | ICD-10-CM | POA: Diagnosis not present

## 2020-01-13 DIAGNOSIS — E669 Obesity, unspecified: Secondary | ICD-10-CM | POA: Diagnosis not present

## 2020-01-13 DIAGNOSIS — I10 Essential (primary) hypertension: Secondary | ICD-10-CM | POA: Diagnosis not present

## 2020-01-13 DIAGNOSIS — J449 Chronic obstructive pulmonary disease, unspecified: Secondary | ICD-10-CM | POA: Diagnosis not present

## 2020-01-13 DIAGNOSIS — R69 Illness, unspecified: Secondary | ICD-10-CM | POA: Diagnosis not present

## 2020-01-13 DIAGNOSIS — N529 Male erectile dysfunction, unspecified: Secondary | ICD-10-CM | POA: Diagnosis not present

## 2020-03-07 DIAGNOSIS — J9801 Acute bronchospasm: Secondary | ICD-10-CM | POA: Diagnosis not present

## 2020-03-07 DIAGNOSIS — J449 Chronic obstructive pulmonary disease, unspecified: Secondary | ICD-10-CM | POA: Diagnosis not present

## 2020-03-07 DIAGNOSIS — I10 Essential (primary) hypertension: Secondary | ICD-10-CM | POA: Diagnosis not present

## 2020-03-07 DIAGNOSIS — J441 Chronic obstructive pulmonary disease with (acute) exacerbation: Secondary | ICD-10-CM | POA: Diagnosis not present

## 2020-03-22 DIAGNOSIS — J9801 Acute bronchospasm: Secondary | ICD-10-CM | POA: Diagnosis not present

## 2020-03-22 DIAGNOSIS — J441 Chronic obstructive pulmonary disease with (acute) exacerbation: Secondary | ICD-10-CM | POA: Diagnosis not present

## 2020-03-22 DIAGNOSIS — I10 Essential (primary) hypertension: Secondary | ICD-10-CM | POA: Diagnosis not present

## 2020-03-22 DIAGNOSIS — E785 Hyperlipidemia, unspecified: Secondary | ICD-10-CM | POA: Diagnosis not present

## 2020-03-29 DIAGNOSIS — I10 Essential (primary) hypertension: Secondary | ICD-10-CM | POA: Diagnosis not present

## 2020-03-29 DIAGNOSIS — J441 Chronic obstructive pulmonary disease with (acute) exacerbation: Secondary | ICD-10-CM | POA: Diagnosis not present

## 2020-03-29 DIAGNOSIS — Z720 Tobacco use: Secondary | ICD-10-CM | POA: Diagnosis not present

## 2020-03-29 DIAGNOSIS — E785 Hyperlipidemia, unspecified: Secondary | ICD-10-CM | POA: Diagnosis not present

## 2020-05-20 ENCOUNTER — Ambulatory Visit
Admission: EM | Admit: 2020-05-20 | Discharge: 2020-05-20 | Disposition: A | Discharge status: Home / Self Care | Payer: Medicare HMO | Attending: Emergency Medicine | Admitting: Emergency Medicine

## 2020-05-20 ENCOUNTER — Other Ambulatory Visit: Payer: Self-pay

## 2020-05-20 DIAGNOSIS — J0100 Acute maxillary sinusitis, unspecified: Secondary | ICD-10-CM | POA: Diagnosis not present

## 2020-05-20 DIAGNOSIS — Z1152 Encounter for screening for COVID-19: Secondary | ICD-10-CM | POA: Diagnosis not present

## 2020-05-20 MED ORDER — PREDNISONE 10 MG PO TABS: 20.0000 mg | ORAL_TABLET | Freq: Every day | ORAL | 0 refills | Status: DC

## 2020-05-20 MED ORDER — AMOXICILLIN-POT CLAVULANATE 875-125 MG PO TABS: 1.0000 | ORAL_TABLET | Freq: Two times a day (BID) | ORAL | 0 refills | Status: DC

## 2020-05-20 NOTE — ED Provider Notes (Signed)
[facility]   [id number] 05/20/20 Arrival Time: 0263   Chief Complaint  Patient presents with  . Nasal Congestion     SUBJECTIVE: History from: patient and family.  BENEDICT KUE is a 69 y.o. male who presented to the urgent care for complaint of nasal congestion and facial pressure for the past 1 to 2-week.  Report green nasal discharge denies sick exposure to COVID, flu or strep.  Denies recent travel.  Has no tried any OTC medication.  Denies aggravating factors.  Denies previous symptoms in the past.   Denies fever, chills, fatigue, sinus pain, rhinorrhea, sore throat, SOB, wheezing, chest pain, nausea, changes in bowel or bladder habits.     ROS: As per HPI.  All other pertinent ROS negative.     Past Medical History:  Diagnosis Date  . Asthma   . Gallbladder disease   . Hypertension   . Sleep apnea    does not use c-pap   Past Surgical History:  Procedure Laterality Date  . CHOLECYSTECTOMY  08/18/2012   Procedure: LAPAROSCOPIC CHOLECYSTECTOMY;  Surgeon: Donato Heinz, MD;  Location: [facility];  Service: General;  Laterality: N/A;   Allergies  Allergen Reactions  . Almond (Diagnostic)   . Aspirin     n/v   No current facility-administered medications on file prior to encounter.   Current Outpatient Medications on File Prior to Encounter  Medication Sig Dispense Refill  . amLODipine-olmesartan (AZOR) 5-20 MG per tablet Take 1 tablet by mouth daily.    . cetirizine (ZYRTEC) 10 MG tablet Take 10 mg by mouth daily.    . Fluticasone-Salmeterol (ADVAIR) 250-50 MCG/DOSE AEPB Inhale 1 puff into the lungs every 12 (twelve) hours.    . ondansetron (ZOFRAN) 8 MG tablet Take 1 tablet (8 mg total) by mouth every 8 (eight) hours as needed for nausea. 4 tablet 0  . oxyCODONE-acetaminophen (PERCOCET/ROXICET) 5-325 MG per tablet Take 1-2 tablets by mouth every 4 (four) hours as needed for pain. 45 tablet 0  . Rivaroxaban 15 & 20 MG TBPK Take 15 mg by mouth in the  morning and at bedtime for 21 days. Follow package directions: Take one 15mg  tablet by mouth twice a day. On day 22, switch to one 20mg  tablet once a day. Take with food. 51 each 0   Social History   Socioeconomic History  . Marital status: Legally Separated    Spouse name: Not on file  . Number of children: Not on file  . Years of education: Not on file  . Highest education level: Not on file  Occupational History  . Not on file  Tobacco Use  . Smoking status: Current Every Day Smoker    Packs/day: 0.50    Years: 40.00    Pack years: 20.00    Types: Cigarettes  Substance and Sexual Activity  . Alcohol use: No  . Drug use: No  . Sexual activity: Yes    Birth control/protection: None  Other Topics Concern  . Not on file  Social History Narrative  . Not on file   Social Determinants of Health   Financial Resource Strain:   . Difficulty of Paying Living Expenses: Not on file  Food Insecurity:   . Worried About Charity fundraiser in the Last Year: Not on file  . Ran Out of Food in the Last Year: Not on file  Transportation Needs:   . Lack of Transportation (Medical): Not on file  . Lack of Transportation (  Non-Medical): Not on file  Physical Activity:   . Days of Exercise per Week: Not on file  . Minutes of Exercise per Session: Not on file  Stress:   . Feeling of Stress : Not on file  Social Connections:   . Frequency of Communication with Friends and Family: Not on file  . Frequency of Social Gatherings with Friends and Family: Not on file  . Attends Religious Services: Not on file  . Active Member of Clubs or Organizations: Not on file  . Attends Archivist Meetings: Not on file  . Marital Status: Not on file  Intimate Partner Violence:   . Fear of Current or Ex-Partner: Not on file  . Emotionally Abused: Not on file  . Physically Abused: Not on file  . Sexually Abused: Not on file   History reviewed. No pertinent family  history.  OBJECTIVE:  Vitals:   05/20/20 1500  BP: (!) 166/104  Pulse: 80  Resp: 20  Temp: 98.4 F (36.9 C)  SpO2: 93%     Physical Exam Vitals and nursing note reviewed.  Constitutional:      General: He is not in acute distress.    Appearance: Normal appearance. He is normal weight. He is not ill-appearing, toxic-appearing or diaphoretic.  HENT:     Head: Normocephalic.     Right Ear: Tympanic membrane, ear canal and external ear normal. There is no impacted cerumen.     Left Ear: Tympanic membrane, ear canal and external ear normal.     Nose: Congestion present.     Right Sinus: Maxillary sinus tenderness present.     Left Sinus: Maxillary sinus tenderness present.  Cardiovascular:     Rate and Rhythm: Normal rate and regular rhythm.     Pulses: Normal pulses.     Heart sounds: Normal heart sounds. No murmur heard.  No friction rub. No gallop.   Pulmonary:     Effort: Pulmonary effort is normal. No respiratory distress.     Breath sounds: No stridor. Wheezing present. No rhonchi or rales.  Chest:     Chest wall: No tenderness.  Neurological:     Mental Status: He is alert and oriented to person, place, and time.    LABS:  No results found for this or any previous visit (from the past 24 hour(s)).   ASSESSMENT & PLAN:  1. Acute non-recurrent maxillary sinusitis     Meds ordered this encounter  Medications  . amoxicillin-clavulanate (AUGMENTIN) 875-125 MG tablet    Sig: Take 1 tablet by mouth every 12 (twelve) hours.    Dispense:  14 tablet    Refill:  0  . predniSONE (DELTASONE) 10 MG tablet    Sig: Take 2 tablets (20 mg total) by mouth daily.    Dispense:  15 tablet    Refill:  0    Discharge instructions  Rest and push fluids Augmentin prescribed.  Take as directed and to completion Prednisone as prescribed take as directed Continue with OTC ibuprofen/tylenol as needed for pain Follow up with PCP or [facility] if symptoms persists Return  or go to the ED if you have any new or worsening symptoms such as fever, chills, worsening sinus pain/pressure, cough, sore throat, chest pain, shortness of breath, abdominal pain, changes in bowel or bladder habits, etc...   Reviewed expectations re: course of current medical issues. Questions answered. Outlined signs and symptoms indicating need for more acute intervention. Patient verbalized understanding. After Visit Summary given.  Emerson Monte, FNP 05/20/20 1526

## 2020-05-20 NOTE — ED Triage Notes (Signed)
Pt presents with nasal congestion and head pressure for over a week

## 2020-05-20 NOTE — Discharge Instructions (Signed)
Rest and push fluids Augmentin prescribed.  Take as directed and to completion Prednisone as prescribed take as directed Continue with OTC ibuprofen/tylenol as needed for pain Follow up with PCP or [facility] if symptoms persists Return or go to the ED if you have any new or worsening symptoms such as fever, chills, worsening sinus pain/pressure, cough, sore throat, chest pain, shortness of breath, abdominal pain, changes in bowel or bladder habits, etc..Marland Kitchen

## 2020-05-21 LAB — NOVEL CORONAVIRUS, NAA: SARS-CoV-2, NAA: NOT DETECTED

## 2020-05-21 LAB — SARS-COV-2, NAA 2 DAY TAT

## 2020-06-28 DIAGNOSIS — E785 Hyperlipidemia, unspecified: Secondary | ICD-10-CM | POA: Diagnosis not present

## 2020-06-28 DIAGNOSIS — Z720 Tobacco use: Secondary | ICD-10-CM | POA: Diagnosis not present

## 2020-06-28 DIAGNOSIS — J441 Chronic obstructive pulmonary disease with (acute) exacerbation: Secondary | ICD-10-CM | POA: Diagnosis not present

## 2020-06-28 DIAGNOSIS — I10 Essential (primary) hypertension: Secondary | ICD-10-CM | POA: Diagnosis not present

## 2020-07-12 DIAGNOSIS — J441 Chronic obstructive pulmonary disease with (acute) exacerbation: Secondary | ICD-10-CM | POA: Diagnosis not present

## 2020-07-19 DIAGNOSIS — Z720 Tobacco use: Secondary | ICD-10-CM | POA: Diagnosis not present

## 2020-07-19 DIAGNOSIS — J441 Chronic obstructive pulmonary disease with (acute) exacerbation: Secondary | ICD-10-CM | POA: Diagnosis not present

## 2020-07-19 DIAGNOSIS — I10 Essential (primary) hypertension: Secondary | ICD-10-CM | POA: Diagnosis not present

## 2020-08-02 DIAGNOSIS — Z961 Presence of intraocular lens: Secondary | ICD-10-CM | POA: Diagnosis not present

## 2020-08-02 DIAGNOSIS — B394 Histoplasmosis capsulati, unspecified: Secondary | ICD-10-CM | POA: Diagnosis not present

## 2020-08-02 DIAGNOSIS — H31013 Macula scars of posterior pole (postinflammatory) (post-traumatic), bilateral: Secondary | ICD-10-CM | POA: Diagnosis not present

## 2020-08-22 DIAGNOSIS — Z125 Encounter for screening for malignant neoplasm of prostate: Secondary | ICD-10-CM | POA: Diagnosis not present

## 2020-08-22 DIAGNOSIS — I10 Essential (primary) hypertension: Secondary | ICD-10-CM | POA: Diagnosis not present

## 2020-08-22 DIAGNOSIS — Z0001 Encounter for general adult medical examination with abnormal findings: Secondary | ICD-10-CM | POA: Diagnosis not present

## 2020-08-22 DIAGNOSIS — R69 Illness, unspecified: Secondary | ICD-10-CM | POA: Diagnosis not present

## 2020-08-22 DIAGNOSIS — J449 Chronic obstructive pulmonary disease, unspecified: Secondary | ICD-10-CM | POA: Diagnosis not present

## 2020-08-22 DIAGNOSIS — Z7189 Other specified counseling: Secondary | ICD-10-CM | POA: Diagnosis not present

## 2020-08-22 DIAGNOSIS — Z720 Tobacco use: Secondary | ICD-10-CM | POA: Diagnosis not present

## 2020-08-27 DIAGNOSIS — I10 Essential (primary) hypertension: Secondary | ICD-10-CM | POA: Diagnosis not present

## 2020-08-27 DIAGNOSIS — E785 Hyperlipidemia, unspecified: Secondary | ICD-10-CM | POA: Diagnosis not present

## 2020-08-27 DIAGNOSIS — Z720 Tobacco use: Secondary | ICD-10-CM | POA: Diagnosis not present

## 2020-08-27 DIAGNOSIS — J441 Chronic obstructive pulmonary disease with (acute) exacerbation: Secondary | ICD-10-CM | POA: Diagnosis not present

## 2020-09-15 DIAGNOSIS — J449 Chronic obstructive pulmonary disease, unspecified: Secondary | ICD-10-CM | POA: Diagnosis not present

## 2020-09-15 DIAGNOSIS — R69 Illness, unspecified: Secondary | ICD-10-CM | POA: Diagnosis not present

## 2020-09-15 DIAGNOSIS — G4733 Obstructive sleep apnea (adult) (pediatric): Secondary | ICD-10-CM | POA: Diagnosis not present

## 2020-09-15 DIAGNOSIS — J309 Allergic rhinitis, unspecified: Secondary | ICD-10-CM | POA: Diagnosis not present

## 2020-09-15 DIAGNOSIS — E669 Obesity, unspecified: Secondary | ICD-10-CM | POA: Diagnosis not present

## 2020-09-15 DIAGNOSIS — Z6834 Body mass index (BMI) 34.0-34.9, adult: Secondary | ICD-10-CM | POA: Diagnosis not present

## 2020-09-15 DIAGNOSIS — E785 Hyperlipidemia, unspecified: Secondary | ICD-10-CM | POA: Diagnosis not present

## 2020-09-15 DIAGNOSIS — N529 Male erectile dysfunction, unspecified: Secondary | ICD-10-CM | POA: Diagnosis not present

## 2020-09-15 DIAGNOSIS — I10 Essential (primary) hypertension: Secondary | ICD-10-CM | POA: Diagnosis not present

## 2020-09-15 DIAGNOSIS — G8929 Other chronic pain: Secondary | ICD-10-CM | POA: Diagnosis not present

## 2020-09-26 DIAGNOSIS — R0981 Nasal congestion: Secondary | ICD-10-CM | POA: Diagnosis not present

## 2020-09-26 DIAGNOSIS — I10 Essential (primary) hypertension: Secondary | ICD-10-CM | POA: Diagnosis not present

## 2020-09-26 DIAGNOSIS — J449 Chronic obstructive pulmonary disease, unspecified: Secondary | ICD-10-CM | POA: Diagnosis not present

## 2020-10-24 DIAGNOSIS — J449 Chronic obstructive pulmonary disease, unspecified: Secondary | ICD-10-CM | POA: Diagnosis not present

## 2020-10-24 DIAGNOSIS — I10 Essential (primary) hypertension: Secondary | ICD-10-CM | POA: Diagnosis not present

## 2020-10-24 DIAGNOSIS — R0981 Nasal congestion: Secondary | ICD-10-CM | POA: Diagnosis not present

## 2020-10-24 DIAGNOSIS — Z0000 Encounter for general adult medical examination without abnormal findings: Secondary | ICD-10-CM | POA: Diagnosis not present

## 2020-10-24 DIAGNOSIS — Z720 Tobacco use: Secondary | ICD-10-CM | POA: Diagnosis not present

## 2020-11-26 DIAGNOSIS — J441 Chronic obstructive pulmonary disease with (acute) exacerbation: Secondary | ICD-10-CM | POA: Diagnosis not present

## 2020-11-26 DIAGNOSIS — I10 Essential (primary) hypertension: Secondary | ICD-10-CM | POA: Diagnosis not present

## 2020-11-26 DIAGNOSIS — E785 Hyperlipidemia, unspecified: Secondary | ICD-10-CM | POA: Diagnosis not present

## 2020-11-26 DIAGNOSIS — Z720 Tobacco use: Secondary | ICD-10-CM | POA: Diagnosis not present

## 2021-01-16 DIAGNOSIS — R0981 Nasal congestion: Secondary | ICD-10-CM | POA: Diagnosis not present

## 2021-01-16 DIAGNOSIS — R69 Illness, unspecified: Secondary | ICD-10-CM | POA: Diagnosis not present

## 2021-01-16 DIAGNOSIS — I10 Essential (primary) hypertension: Secondary | ICD-10-CM | POA: Diagnosis not present

## 2021-01-16 DIAGNOSIS — J449 Chronic obstructive pulmonary disease, unspecified: Secondary | ICD-10-CM | POA: Diagnosis not present

## 2021-01-26 DIAGNOSIS — E785 Hyperlipidemia, unspecified: Secondary | ICD-10-CM | POA: Diagnosis not present

## 2021-01-26 DIAGNOSIS — Z720 Tobacco use: Secondary | ICD-10-CM | POA: Diagnosis not present

## 2021-01-26 DIAGNOSIS — I10 Essential (primary) hypertension: Secondary | ICD-10-CM | POA: Diagnosis not present

## 2021-01-26 DIAGNOSIS — J441 Chronic obstructive pulmonary disease with (acute) exacerbation: Secondary | ICD-10-CM | POA: Diagnosis not present

## 2021-02-02 DIAGNOSIS — J324 Chronic pansinusitis: Secondary | ICD-10-CM | POA: Diagnosis not present

## 2021-02-02 DIAGNOSIS — J3489 Other specified disorders of nose and nasal sinuses: Secondary | ICD-10-CM | POA: Diagnosis not present

## 2021-02-02 DIAGNOSIS — J339 Nasal polyp, unspecified: Secondary | ICD-10-CM | POA: Diagnosis not present

## 2021-02-21 DIAGNOSIS — J0140 Acute pansinusitis, unspecified: Secondary | ICD-10-CM | POA: Diagnosis not present

## 2021-02-21 DIAGNOSIS — J3489 Other specified disorders of nose and nasal sinuses: Secondary | ICD-10-CM | POA: Diagnosis not present

## 2021-02-21 DIAGNOSIS — J339 Nasal polyp, unspecified: Secondary | ICD-10-CM | POA: Diagnosis not present

## 2021-02-21 DIAGNOSIS — J330 Polyp of nasal cavity: Secondary | ICD-10-CM | POA: Diagnosis not present

## 2021-02-21 DIAGNOSIS — J324 Chronic pansinusitis: Secondary | ICD-10-CM | POA: Diagnosis not present

## 2021-02-21 DIAGNOSIS — J342 Deviated nasal septum: Secondary | ICD-10-CM | POA: Diagnosis not present

## 2021-03-02 DIAGNOSIS — J3489 Other specified disorders of nose and nasal sinuses: Secondary | ICD-10-CM | POA: Diagnosis not present

## 2021-03-02 DIAGNOSIS — J339 Nasal polyp, unspecified: Secondary | ICD-10-CM | POA: Diagnosis not present

## 2021-03-29 DIAGNOSIS — I10 Essential (primary) hypertension: Secondary | ICD-10-CM | POA: Diagnosis not present

## 2021-03-29 DIAGNOSIS — J441 Chronic obstructive pulmonary disease with (acute) exacerbation: Secondary | ICD-10-CM | POA: Diagnosis not present

## 2021-03-29 DIAGNOSIS — E785 Hyperlipidemia, unspecified: Secondary | ICD-10-CM | POA: Diagnosis not present

## 2021-03-29 DIAGNOSIS — Z720 Tobacco use: Secondary | ICD-10-CM | POA: Diagnosis not present

## 2021-04-18 DIAGNOSIS — J449 Chronic obstructive pulmonary disease, unspecified: Secondary | ICD-10-CM | POA: Diagnosis not present

## 2021-04-18 DIAGNOSIS — K8500 Idiopathic acute pancreatitis without necrosis or infection: Secondary | ICD-10-CM | POA: Diagnosis not present

## 2021-04-18 DIAGNOSIS — R0981 Nasal congestion: Secondary | ICD-10-CM | POA: Diagnosis not present

## 2021-04-18 DIAGNOSIS — I10 Essential (primary) hypertension: Secondary | ICD-10-CM | POA: Diagnosis not present

## 2021-04-18 DIAGNOSIS — J441 Chronic obstructive pulmonary disease with (acute) exacerbation: Secondary | ICD-10-CM | POA: Diagnosis not present

## 2021-05-25 ENCOUNTER — Other Ambulatory Visit: Payer: Self-pay | Admitting: Otolaryngology

## 2021-05-29 DIAGNOSIS — J441 Chronic obstructive pulmonary disease with (acute) exacerbation: Secondary | ICD-10-CM | POA: Diagnosis not present

## 2021-05-29 DIAGNOSIS — I10 Essential (primary) hypertension: Secondary | ICD-10-CM | POA: Diagnosis not present

## 2021-05-29 DIAGNOSIS — E785 Hyperlipidemia, unspecified: Secondary | ICD-10-CM | POA: Diagnosis not present

## 2021-06-07 ENCOUNTER — Encounter (HOSPITAL_BASED_OUTPATIENT_CLINIC_OR_DEPARTMENT_OTHER): Payer: Self-pay | Admitting: Otolaryngology

## 2021-06-07 ENCOUNTER — Other Ambulatory Visit: Payer: Self-pay

## 2021-06-13 ENCOUNTER — Other Ambulatory Visit: Payer: Self-pay

## 2021-06-13 ENCOUNTER — Encounter (HOSPITAL_BASED_OUTPATIENT_CLINIC_OR_DEPARTMENT_OTHER)
Admission: RE | Admit: 2021-06-13 | Discharge: 2021-06-13 | Disposition: A | Discharge status: Home / Self Care | Payer: Medicare HMO | Source: Ambulatory Visit | Attending: Otolaryngology | Admitting: Otolaryngology

## 2021-06-13 DIAGNOSIS — Z01810 Encounter for preprocedural cardiovascular examination: Secondary | ICD-10-CM | POA: Insufficient documentation

## 2021-06-13 NOTE — Progress Notes (Signed)
Sent message reminding pt to come in for EKG today.

## 2021-06-14 ENCOUNTER — Other Ambulatory Visit: Payer: Self-pay

## 2021-06-14 ENCOUNTER — Encounter (HOSPITAL_BASED_OUTPATIENT_CLINIC_OR_DEPARTMENT_OTHER): Payer: Self-pay | Admitting: Otolaryngology

## 2021-06-14 ENCOUNTER — Encounter (HOSPITAL_BASED_OUTPATIENT_CLINIC_OR_DEPARTMENT_OTHER)
Admission: RE | Disposition: A | Discharge status: Home / Self Care | Payer: Self-pay | Source: Home / Self Care | Attending: Otolaryngology

## 2021-06-14 ENCOUNTER — Ambulatory Visit (HOSPITAL_BASED_OUTPATIENT_CLINIC_OR_DEPARTMENT_OTHER): Payer: Medicare HMO | Admitting: Anesthesiology

## 2021-06-14 ENCOUNTER — Ambulatory Visit (HOSPITAL_BASED_OUTPATIENT_CLINIC_OR_DEPARTMENT_OTHER)
Admission: RE | Admit: 2021-06-14 | Discharge: 2021-06-14 | Disposition: A | Discharge status: Home / Self Care | Payer: Medicare HMO | Attending: Otolaryngology | Admitting: Otolaryngology

## 2021-06-14 DIAGNOSIS — J339 Nasal polyp, unspecified: Secondary | ICD-10-CM | POA: Diagnosis not present

## 2021-06-14 DIAGNOSIS — F17200 Nicotine dependence, unspecified, uncomplicated: Secondary | ICD-10-CM | POA: Diagnosis not present

## 2021-06-14 DIAGNOSIS — R69 Illness, unspecified: Secondary | ICD-10-CM | POA: Diagnosis not present

## 2021-06-14 DIAGNOSIS — J324 Chronic pansinusitis: Secondary | ICD-10-CM | POA: Diagnosis not present

## 2021-06-14 DIAGNOSIS — J338 Other polyp of sinus: Secondary | ICD-10-CM | POA: Diagnosis not present

## 2021-06-14 DIAGNOSIS — J331 Polypoid sinus degeneration: Secondary | ICD-10-CM | POA: Diagnosis not present

## 2021-06-14 DIAGNOSIS — I10 Essential (primary) hypertension: Secondary | ICD-10-CM | POA: Diagnosis not present

## 2021-06-14 DIAGNOSIS — J343 Hypertrophy of nasal turbinates: Secondary | ICD-10-CM | POA: Diagnosis not present

## 2021-06-14 DIAGNOSIS — J3489 Other specified disorders of nose and nasal sinuses: Secondary | ICD-10-CM | POA: Diagnosis not present

## 2021-06-14 HISTORY — DX: Chronic obstructive pulmonary disease, unspecified: J44.9

## 2021-06-14 HISTORY — DX: Unspecified osteoarthritis, unspecified site: M19.90

## 2021-06-14 HISTORY — PX: ENDOSCOPIC TURBINATE REDUCTION: SHX6489

## 2021-06-14 HISTORY — PX: SINUS ENDO W/FUSION: SHX777

## 2021-06-14 SURGERY — SINUS SURGERY, ENDOSCOPIC, USING COMPUTER-ASSISTED NAVIGATION
Anesthesia: General | Site: Nose | Laterality: Bilateral

## 2021-06-14 MED ORDER — SUGAMMADEX SODIUM 500 MG/5ML IV SOLN
INTRAVENOUS | Status: AC
  Filled 2021-06-14: qty 5

## 2021-06-14 MED ORDER — FENTANYL CITRATE (PF) 100 MCG/2ML IJ SOLN
INTRAMUSCULAR | Status: AC
  Filled 2021-06-14: qty 2

## 2021-06-14 MED ORDER — DEXAMETHASONE SODIUM PHOSPHATE 10 MG/ML IJ SOLN
INTRAMUSCULAR | Status: AC
  Filled 2021-06-14: qty 1

## 2021-06-14 MED ORDER — HEMOSTATIC AGENTS (NO CHARGE) OPTIME
TOPICAL | Status: DC | PRN
  Administered 2021-06-14: 1 via TOPICAL

## 2021-06-14 MED ORDER — CEFAZOLIN SODIUM-DEXTROSE 2-4 GM/100ML-% IV SOLN
INTRAVENOUS | Status: AC
  Filled 2021-06-14: qty 100

## 2021-06-14 MED ORDER — MIDAZOLAM HCL 5 MG/5ML IJ SOLN
INTRAMUSCULAR | Status: DC | PRN
  Administered 2021-06-14: 2 mg via INTRAVENOUS

## 2021-06-14 MED ORDER — FENTANYL CITRATE (PF) 100 MCG/2ML IJ SOLN
INTRAMUSCULAR | Status: DC | PRN
  Administered 2021-06-14: 100 ug via INTRAVENOUS
  Administered 2021-06-14 (×2): 50 ug via INTRAVENOUS

## 2021-06-14 MED ORDER — AMISULPRIDE (ANTIEMETIC) 5 MG/2ML IV SOLN: 10.0000 mg | Freq: Once | INTRAVENOUS | Status: DC | PRN

## 2021-06-14 MED ORDER — LIDOCAINE-EPINEPHRINE 1 %-1:100000 IJ SOLN
INTRAMUSCULAR | Status: AC
  Filled 2021-06-14: qty 1

## 2021-06-14 MED ORDER — ACETAMINOPHEN 500 MG PO TABS
1000.0000 mg | ORAL_TABLET | Freq: Once | ORAL | Status: AC
  Administered 2021-06-14: 1000 mg via ORAL

## 2021-06-14 MED ORDER — EPHEDRINE 5 MG/ML INJ
INTRAVENOUS | Status: AC
  Filled 2021-06-14: qty 5

## 2021-06-14 MED ORDER — OXYMETAZOLINE HCL 0.05 % NA SOLN
NASAL | Status: AC
  Filled 2021-06-14: qty 30

## 2021-06-14 MED ORDER — 0.9 % SODIUM CHLORIDE (POUR BTL) OPTIME
TOPICAL | Status: DC | PRN
  Administered 2021-06-14: 200 mL

## 2021-06-14 MED ORDER — LACTATED RINGERS IV SOLN: INTRAVENOUS | Status: DC

## 2021-06-14 MED ORDER — LIDOCAINE 2% (20 MG/ML) 5 ML SYRINGE
INTRAMUSCULAR | Status: AC
  Filled 2021-06-14: qty 5

## 2021-06-14 MED ORDER — OXYMETAZOLINE HCL 0.05 % NA SOLN
NASAL | Status: DC | PRN
  Administered 2021-06-14: 1 via TOPICAL

## 2021-06-14 MED ORDER — DOCUSATE SODIUM 100 MG PO CAPS: 100.0000 mg | ORAL_CAPSULE | Freq: Two times a day (BID) | ORAL | 0 refills | Status: AC | PRN

## 2021-06-14 MED ORDER — EPHEDRINE SULFATE 50 MG/ML IJ SOLN
INTRAMUSCULAR | Status: DC | PRN
  Administered 2021-06-14: 20 mg via INTRAVENOUS

## 2021-06-14 MED ORDER — MUPIROCIN 2 % EX OINT
TOPICAL_OINTMENT | CUTANEOUS | Status: DC | PRN
  Administered 2021-06-14: 1 via TOPICAL

## 2021-06-14 MED ORDER — ACETAMINOPHEN 500 MG PO TABS
ORAL_TABLET | ORAL | Status: AC
  Filled 2021-06-14: qty 2

## 2021-06-14 MED ORDER — PROPOFOL 10 MG/ML IV BOLUS
INTRAVENOUS | Status: DC | PRN
  Administered 2021-06-14: 150 mg via INTRAVENOUS

## 2021-06-14 MED ORDER — SUGAMMADEX SODIUM 200 MG/2ML IV SOLN
INTRAVENOUS | Status: DC | PRN
  Administered 2021-06-14: 200 mg via INTRAVENOUS

## 2021-06-14 MED ORDER — FENTANYL CITRATE (PF) 100 MCG/2ML IJ SOLN: 25.0000 ug | INTRAMUSCULAR | Status: DC | PRN

## 2021-06-14 MED ORDER — SODIUM CHLORIDE 0.9 % IV SOLN
INTRAVENOUS | Status: AC | PRN
  Administered 2021-06-14: 200 mL via INTRAMUSCULAR

## 2021-06-14 MED ORDER — ONDANSETRON HCL 4 MG/2ML IJ SOLN: 4.0000 mg | Freq: Once | INTRAMUSCULAR | Status: DC | PRN

## 2021-06-14 MED ORDER — HYDROCODONE-ACETAMINOPHEN 5-325 MG PO TABS: 1.0000 | ORAL_TABLET | Freq: Four times a day (QID) | ORAL | 0 refills | Status: AC | PRN

## 2021-06-14 MED ORDER — DEXAMETHASONE SODIUM PHOSPHATE 4 MG/ML IJ SOLN
INTRAMUSCULAR | Status: DC | PRN
  Administered 2021-06-14: 10 mg via INTRAVENOUS

## 2021-06-14 MED ORDER — ONDANSETRON HCL 4 MG/2ML IJ SOLN
INTRAMUSCULAR | Status: AC
  Filled 2021-06-14: qty 2

## 2021-06-14 MED ORDER — TRANEXAMIC ACID-NACL 1000-0.7 MG/100ML-% IV SOLN
INTRAVENOUS | Status: DC | PRN
  Administered 2021-06-14: 1000 mg via INTRAVENOUS

## 2021-06-14 MED ORDER — MIDAZOLAM HCL 2 MG/2ML IJ SOLN
INTRAMUSCULAR | Status: AC
  Filled 2021-06-14: qty 2

## 2021-06-14 MED ORDER — OXYCODONE HCL 5 MG/5ML PO SOLN: 5.0000 mg | Freq: Once | ORAL | Status: DC | PRN

## 2021-06-14 MED ORDER — LIDOCAINE-EPINEPHRINE 1 %-1:100000 IJ SOLN
INTRAMUSCULAR | Status: DC | PRN
  Administered 2021-06-14: 10 mL

## 2021-06-14 MED ORDER — ONDANSETRON HCL 4 MG/2ML IJ SOLN
INTRAMUSCULAR | Status: DC | PRN
  Administered 2021-06-14: 4 mg via INTRAVENOUS

## 2021-06-14 MED ORDER — ROCURONIUM BROMIDE 100 MG/10ML IV SOLN
INTRAVENOUS | Status: DC | PRN
  Administered 2021-06-14: 60 mg via INTRAVENOUS

## 2021-06-14 MED ORDER — OXYCODONE HCL 5 MG PO TABS: 5.0000 mg | ORAL_TABLET | Freq: Once | ORAL | Status: DC | PRN

## 2021-06-14 MED ORDER — LIDOCAINE HCL (CARDIAC) PF 100 MG/5ML IV SOSY
PREFILLED_SYRINGE | INTRAVENOUS | Status: DC | PRN
  Administered 2021-06-14: 60 mg via INTRAVENOUS

## 2021-06-14 MED ORDER — MUPIROCIN 2 % EX OINT
TOPICAL_OINTMENT | CUTANEOUS | Status: AC
  Filled 2021-06-14: qty 22

## 2021-06-14 SURGICAL SUPPLY — 73 items
ATTRACTOMAT 16X20 MAGNETIC DRP (DRAPES) IMPLANT
BALLN FRONTAL NUVENT 6X17 70D (BALLOONS) ×3
BALLOON FRONTAL NVNT 6X17 70D (BALLOONS) IMPLANT
BLADE INF TURB ROT M4 2 5PK (BLADE) ×1 IMPLANT
BLADE INF TURB ROT M4 2MM 5PK (BLADE) ×1
BLADE ROTATE RAD 40 4 M4 (BLADE) IMPLANT
BLADE ROTATE RAD 40 4MM M4 (BLADE)
BLADE ROTATE TRICUT 4MX13CM M4 (BLADE) ×1
BLADE ROTATE TRICUT 4X13 M4 (BLADE) ×2 IMPLANT
BLADE SURG 15 STRL LF DISP TIS (BLADE) IMPLANT
BLADE SURG 15 STRL SS (BLADE)
CANISTER SUC SOCK COL 7IN (MISCELLANEOUS) IMPLANT
CANISTER SUCT 1200ML W/VALVE (MISCELLANEOUS) ×6 IMPLANT
COAGULATOR SUCT 6 FR SWTCH (ELECTROSURGICAL)
COAGULATOR SUCT 8FR VV (MISCELLANEOUS) IMPLANT
COAGULATOR SUCT SWTCH 10FR 6 (ELECTROSURGICAL) IMPLANT
DEFOGGER MIRROR 1QT (MISCELLANEOUS) ×3 IMPLANT
DRESSING NASAL KENNEDY 3.5X.9 (MISCELLANEOUS) IMPLANT
DRSG NASAL KENNEDY 3.5X.9 (MISCELLANEOUS)
DRSG NASOPORE 8CM (GAUZE/BANDAGES/DRESSINGS) ×3 IMPLANT
ELECT COATED BLADE 2.86 ST (ELECTRODE) IMPLANT
ELECT REM PT RETURN 9FT ADLT (ELECTROSURGICAL) ×3
ELECTRODE REM PT RTRN 9FT ADLT (ELECTROSURGICAL) ×1 IMPLANT
GLOVE SURG ENC MOIS LTX SZ6.5 (GLOVE) ×3 IMPLANT
GLOVE SURG POLYISO LF SZ7 (GLOVE) ×2 IMPLANT
GLOVE SURG UNDER POLY LF SZ7 (GLOVE) ×4 IMPLANT
GOWN STRL REUS W/ TWL LRG LVL3 (GOWN DISPOSABLE) ×2 IMPLANT
GOWN STRL REUS W/TWL LRG LVL3 (GOWN DISPOSABLE) ×6
HEMOSTAT ARISTA ABSORB 3G PWDR (HEMOSTASIS) ×2 IMPLANT
IMPL PROPEL MINI SINUS (Prosthesis and Implant ENT) IMPLANT
IMPLANT PROPEL MINI SINUS (Prosthesis and Implant ENT) ×6 IMPLANT
INFLATOR BALLN (BALLOONS) ×3
INFLATOR BALLOON W/TUBE (BALLOONS) IMPLANT
IV NS 500ML (IV SOLUTION) ×3
IV NS 500ML BAXH (IV SOLUTION) ×1 IMPLANT
IV SET EXT 30 76VOL 4 MALE LL (IV SETS) IMPLANT
NDL HYPO 25X1 1.5 SAFETY (NEEDLE) ×1 IMPLANT
NDL HYPO 27GX1-1/4 (NEEDLE) IMPLANT
NDL SAFETY ECLIPSE 18X1.5 (NEEDLE) IMPLANT
NDL SPNL 25GX3.5 QUINCKE BL (NEEDLE) IMPLANT
NEEDLE HYPO 18GX1.5 SHARP (NEEDLE)
NEEDLE HYPO 25X1 1.5 SAFETY (NEEDLE) ×6 IMPLANT
NEEDLE HYPO 27GX1-1/4 (NEEDLE) IMPLANT
NEEDLE SPNL 25GX3.5 QUINCKE BL (NEEDLE) ×3 IMPLANT
NS IRRIG 1000ML POUR BTL (IV SOLUTION) ×3 IMPLANT
PACK BASIN DAY SURGERY FS (CUSTOM PROCEDURE TRAY) ×3 IMPLANT
PACK ENT DAY SURGERY (CUSTOM PROCEDURE TRAY) ×3 IMPLANT
PATTIES SURGICAL .5 X3 (DISPOSABLE) ×2 IMPLANT
PENCIL SMOKE EVACUATOR (MISCELLANEOUS) IMPLANT
SHEET MEDIUM DRAPE 40X70 STRL (DRAPES) IMPLANT
SOL ANTI FOG 6CC (MISCELLANEOUS) ×1 IMPLANT
SOLUTION ANTI FOG 6CC (MISCELLANEOUS) ×2
SPLINT NASAL AIRWAY SILICONE (MISCELLANEOUS) IMPLANT
SPONGE GAUZE 2X2 8PLY STER LF (GAUZE/BANDAGES/DRESSINGS) ×1
SPONGE GAUZE 2X2 8PLY STRL LF (GAUZE/BANDAGES/DRESSINGS) ×2 IMPLANT
SPONGE NEURO XRAY DETECT 1X3 (DISPOSABLE) ×3 IMPLANT
SUT CHROMIC 4 0 P 3 18 (SUTURE) IMPLANT
SUT ETHILON 3 0 FSL (SUTURE) IMPLANT
SUT PLAIN 4 0 ~~LOC~~ 1 (SUTURE) IMPLANT
SUT SILK 2 0 SH (SUTURE) IMPLANT
SWAB COLLECTION DEVICE MRSA (MISCELLANEOUS) IMPLANT
SWAB CULTURE ESWAB REG 1ML (MISCELLANEOUS) IMPLANT
SYR CONTROL 10ML LL (SYRINGE) IMPLANT
SYR TB 1ML LL NO SAFETY (SYRINGE) ×5 IMPLANT
TOWEL GREEN STERILE FF (TOWEL DISPOSABLE) ×3 IMPLANT
TRACKER ENT INSTRUMENT (MISCELLANEOUS) ×6 IMPLANT
TRACKER ENT PATIENT (MISCELLANEOUS) ×3 IMPLANT
TUBE CONNECTING 20'X1/4 (TUBING) ×1
TUBE CONNECTING 20X1/4 (TUBING) ×2 IMPLANT
TUBE SALEM SUMP 12R W/ARV (TUBING) IMPLANT
TUBE SALEM SUMP 16 FR W/ARV (TUBING) ×3 IMPLANT
TUBING STRAIGHTSHOT EPS 5PK (TUBING) IMPLANT
YANKAUER SUCT BULB TIP NO VENT (SUCTIONS) ×5 IMPLANT

## 2021-06-14 NOTE — Anesthesia Preprocedure Evaluation (Signed)
Anesthesia Evaluation  Patient identified by MRN, date of birth, ID band Patient awake    Reviewed: Allergy & Precautions, NPO status , Patient's Chart, lab work & pertinent test results  History of Anesthesia Complications Negative for: history of anesthetic complications  Airway Mallampati: I  TM Distance: >3 FB Neck ROM: Full    Dental  (+) Dental Advisory Given, Teeth Intact   Pulmonary asthma , sleep apnea , COPD, Current Smoker,    Pulmonary exam normal        Cardiovascular hypertension, Pt. on medications + DVT  Normal cardiovascular exam     Neuro/Psych negative neurological ROS     GI/Hepatic negative GI ROS, Neg liver ROS,   Endo/Other  Prednisone use  Renal/GU negative Renal ROS  negative genitourinary   Musculoskeletal  (+) Arthritis ,   Abdominal   Peds  Hematology negative hematology ROS (+)   Anesthesia Other Findings   Reproductive/Obstetrics                           Anesthesia Physical Anesthesia Plan  ASA: 3  Anesthesia Plan: General   Post-op Pain Management:    Induction: Intravenous  PONV Risk Score and Plan: 1 and Ondansetron, Dexamethasone, Treatment may vary due to age or medical condition and Midazolam  Airway Management Planned: Oral ETT  Additional Equipment: None  Intra-op Plan:   Post-operative Plan: Extubation in OR  Informed Consent: I have reviewed the patients History and Physical, chart, labs and discussed the procedure including the risks, benefits and alternatives for the proposed anesthesia with the patient or authorized representative who has indicated his/her understanding and acceptance.     Dental advisory given  Plan Discussed with:   Anesthesia Plan Comments:         Anesthesia Quick Evaluation

## 2021-06-14 NOTE — Anesthesia Postprocedure Evaluation (Signed)
Anesthesia Post Note  Patient: George Mays  Procedure(s) Performed: BILATERAL FUNCTIONAL ENDOSCOPIC SINUS SURGERY WITH IMAGE GUIDANCE; BILATERAL MAXILLARY ANTROSTOMY WITH TISSUE REMOVAL; TOTAL ETHMOIDECTOMY; NASOFRONTAL SINUS EXPLORTION; BILATERAL SPHENOIDOTOMY WITH TISSUE REMOVAL ON LEFT; PLACEMENT OF PROPEL STENTS/FRONTAL BALLOONS (Bilateral: Nose) BILATERAL INFERIOR TURBINATE REDUCTION (Bilateral: Nose)     Patient location during evaluation: PACU Anesthesia Type: General Level of consciousness: awake and alert Pain management: pain level controlled Vital Signs Assessment: post-procedure vital signs reviewed and stable Respiratory status: spontaneous breathing, nonlabored ventilation and respiratory function stable Cardiovascular status: blood pressure returned to baseline and stable Postop Assessment: no apparent nausea or vomiting Anesthetic complications: no   No notable events documented.  Last Vitals:  Vitals:   06/14/21 1500 06/14/21 1502  BP: (!) 153/105 (!) 163/104  Pulse: 84 85  Resp: 13 15  Temp:    SpO2: 93% 95%    Last Pain:  Vitals:   06/14/21 1439  TempSrc:   PainSc: 0-No pain                 Lidia Collum

## 2021-06-14 NOTE — Discharge Instructions (Addendum)
[facility] ([facility]) Post Operative Instructions  Office: [phone]  The Surgery Itself Endoscopic sinus surgery (with or without septoplasty and turbinate reduction) involves general anesthesia, typically for one to two hours. Patients may be sedated for several hours after surgery and may remain sleepy for the better part of the day. Nausea and vomiting are occasionally seen, and usually resolve by the evening of surgery - even without additional medications. Almost all patients can go home the day of surgery.  After Surgery  Facial pressure and fullness similar to a sinus infection/headache is normal after surgery. Breathing through your nose is also difficult due to swelling. A humidifier or vaporizer can be used in the bedroom to prevent throat pain with mouth breathing.   Bloody nasal drainage is normal after this surgery for 5-7 days, usually decreasing in volume with each day that passes. Drainage will flow from the front of the nose and down the back of the throat. Make sure you spit out blood drainage that drips down the back of your throat to prevent nausea/vomiting. You will have a nasal drip pad/sling with gauze to catch drainage from the front of your nose. The dressing may need to be changed frequently during the first 24 hours following surgery. In case of profuse nasal bleeding, you may apply ice to the bridge of the nose and pinch the nose just above the tip and hold for 10 minutes; if bleeding continues, contact the doctors office.   Frequent hot showers or saline nasal rinses (NeilMed) will help break up congestion and clear any clot or mucus that builds up within the nose after surgery. This can be started the day after surgery.   It is more comfortable to sleep with extra pillows or in a recliner for the first few days after surgery until the drainage begins to resolve.    Do not blow your nose for 2 weeks after surgery.   Avoid lifting > 10 lbs. and no  vigorous exercise for 2 weeks after Surgery.   Avoid airplane travel for 2 weeks following sinus surgery; the cabin pressure changes can cause pain and swelling within the nose/sinuses.   Sense of smell and taste are often diminished for several weeks after surgery. There may be some tenderness or numbness in your upper front teeth, which is normal after surgery. You may express old clot, discolored mucus or very large nasal crusts from your nose for up to 3-4 weeks after surgery; depending on how frequently and how effectively you irrigate your nose with the saltwater spray.   You may have absorbable sutures inside of your nose after surgery that will slowly dissolve in 2-3 weeks. Be careful when clearing crusts from the nose since they may be attached to these sutures.  Medications  Pain medication can be used for pain as prescribed. Pain and pressure in the nose is expected after surgery. As the surgical site heals, pain will resolve over the course of a week. Pain medications can cause nausea, which can be prevented if you take them with food or milk.   You may be given an antibiotic for one week after surgery to prevent infection. Take this medication with food to prevent nausea or vomiting.   You can use 2 nasal sprays after surgery: Afrin can be used up to 2 times a day for up to 5 days after surgery (best before bed) to reduce bloody drainage from the nose for the first few days after surgery. Saline/salt   water spray can be used as often as you would like starting the day after surgery to prevent crusting inside of the nose.   Take all of your routine medications as prescribed, unless told otherwise by your surgeon. Any medications that thin the blood should be avoided. This includes aspirin. Avoid aspirin-like products for the first 72 hours after surgery (Advil, Motrin, Excedrin, Alieve, Celebrex, Naprosyn), but you may use them as needed for pain after 72 hours.    Post Anesthesia Home  Care Instructions  Activity: Get plenty of rest for the remainder of the day. A responsible individual must stay with you for 24 hours following the procedure.  For the next 24 hours, DO NOT: -Drive a car -Paediatric nurse -Drink alcoholic beverages -Take any medication unless instructed by your physician -Make any legal decisions or sign important papers.  Meals: Start with liquid foods such as gelatin or soup. Progress to regular foods as tolerated. Avoid greasy, spicy, heavy foods. If nausea and/or vomiting occur, drink only clear liquids until the nausea and/or vomiting subsides. Call your physician if vomiting continues.  Special Instructions/Symptoms: Your throat may feel dry or sore from the anesthesia or the breathing tube placed in your throat during surgery. If this causes discomfort, gargle with warm salt water. The discomfort should disappear within 24 hours.  If you had a scopolamine patch placed behind your ear for the management of post- operative nausea and/or vomiting:  1. The medication in the patch is effective for 72 hours, after which it should be removed.  Wrap patch in a tissue and discard in the trash. Wash hands thoroughly with soap and water. 2. You may remove the patch earlier than 72 hours if you experience unpleasant side effects which may include dry mouth, dizziness or visual disturbances. 3. Avoid touching the patch. Wash your hands with soap and water after contact with the patch.

## 2021-06-14 NOTE — Op Note (Signed)
OPERATIVE NOTE  ELIC VENCILL Date/Time of Admission: 06/14/2021  9:51 AM  CSN: [id number];HER:[id number] Attending Provider: Ebbie Latus A, DO Room/Bed: [facility]SP/NONE DOB: 1951/07/06 Age: 70 y.o.   Pre-Op Diagnosis: Nasal obstruction; Nasal polyps; Chronic pansinusitis  Post-Op Diagnosis: Nasal obstruction; Nasal polyps; Chronic pansinusitis  Procedure: Procedure(s): BILATERAL FUNCTIONAL ENDOSCOPIC SINUS SURGERY WITH IMAGE GUIDANCE; BILATERAL MAXILLARY ANTROSTOMY WITH TISSUE REMOVAL; TOTAL ETHMOIDECTOMY; NASOFRONTAL RECESS EXPLORATION WITH BALLOON DILATION; BILATERAL SPHENOIDOTOMY WITH TISSUE REMOVAL ON LEFT; PLACEMENT OF PROPEL STENTS/FRONTAL BALLOONS BILATERAL INFERIOR TURBINATE REDUCTION  Anesthesia: General  Surgeon(s): [place], DO  Staff: Circulator: Eston Esters, RN Relief Circulator: Eda Paschal, RN Scrub Person: Ofilia Neas Vendor Representative : Janene Harvey  Implants: Implant Name Type Inv. Item Serial No. Manufacturer Lot No. LRB No. Used Action  IMPLANT PROPEL MINI SINUS - [id number] Prosthesis and Implant ENT IMPLANT PROPEL MINI SINUS  INTERSECT ENT [id number] Left 1 Implanted  IMPLANT PROPEL MINI SINUS - [id number] Prosthesis and Implant ENT IMPLANT PROPEL MINI SINUS  INTERSECT ENT [id number]  1 Implanted    Specimens: ID Type Source Tests Collected by Time Destination  1 : Left Sinus Contents Tissue PATH Sinus Contents/Nasal Polyps SURGICAL PATHOLOGY Saramarie Stinger A, DO 06/14/2021 1320   2 : Right Sinus Contents Tissue PATH Sinus Contents/Nasal Polyps SURGICAL PATHOLOGY Osa Campoli A, DO 94/70/9628 3662     Complications: None  EBL: 100 ML  Condition: stable  Operative Findings:  Obstruction of bilateral nasal cavities with extensive nasal polyposis.  Chronic osteitic changes secondary to chronic sinusitis.  No active purulent drainage.  Description of Operation: Once operative consent was obtained and  the site and surgery were confirmed with the patient and the operating room team, the patient was brought back to the operating room and general endotracheal anesthesia was obtained. The image-guided system was attached and noted to be in good calibration. Lidocaine 1% with 1:100,000 epinephrine was injected into the nasal polyps and inferior turbinates bilaterally. Afrin-soaked pledgets were placed into the nasal cavity, and the patient was prepped and draped in sterile fashion. Attention was first turned to the left nasal cavity.  Nasal polyps were filling the entirety of the cavity, and were removed using the microdebrider until normal sinonasal landmarks were identified.  Additional 1% lidocaine with 1-100,000 epinephrine was then injected into the middle turbinate. The middle turbinate was medialized and a ball-tipped seeker was used to slightly anterior fracture the uncinate process.  The frontal recess was dilated to 6 mm in two locations with a navigatable frontal balloon. The inferior frontal recess was then explored and widened. Attention was then turned to the uncinate process, which was completely fractured anteriorly and then removed with a combination of backbiter and a microdebrider. A ball-tipped seeker was used to identify the natural os of the maxillary sinus, and it was widened with combination of the backbiter, the microdebrider, the olive tip suction, and the straight TruCut until it was widely patent. Polypoid tissue was removed. The frontal and maxillary sinus cavities were irrigated. Utilizing image-guided suction, the medial inferior quadrant of the ethmoid bulla was entered bluntly, the walls were fractured and the bulla was removed utilizing a microdebrider. Anterior and posterior ethmoidectomy were completed in a standard fashion by first locating the anterior face of the sphenoid sinus and then following the skull base and the lamina papyracea to fully take down all ethmoid cells. This  was done utilizing a combination of the straight suction, the J curette, the ball-tipped seeker, and  the microdebrider. The os of the sphenoid sinus was identified medially and inferiorly and entered with image guided suction. It was then widened with a micro debrider.  Polypoid tissue was removed from the sinus and os. Attention was then turned to the inferior turbinate. It was outfractured and then submucous resection was performed by making an incision in the leading edge with a 15 blade, separating the mucosa from bone with a Cottle elevator and then using the micro debrider via a turbinate blade to remove bone.  After the sinus was copiously irrigated, Arista was placed in the nasal cavity, followed by a mini propel stent, and nasopore nasal packing was placed lateral to the middle turbinate in the axilla between it and the lateral nasal wall. Attention was then turned to the left side. This exact same procedure was performed on the left side of the sinonasal cavity, with the exception of the sphenoidotomy, which was not performed on the left. Attention was then turned to the inferior turbinates.They were outfractured and then submucous resection was performed by making an incision in the leading edge with a 15 blade, separating the mucosa from bone with a Cottle elevator and then using the micro debrider with a turbinate blade to remove bone.   An orogastric tube was placed and the stomach cavity was suctioned to reduce postoperative nausea. The patient was turned over to anesthesia service and was extubated in the operating room and transferred to the PACU in stable condition. The patient will be discharged today and followed up in the ENT clinic in 1 week for postoperative check.    Jason Coop, [facility]  06/14/2021

## 2021-06-14 NOTE — Transfer of Care (Signed)
Immediate Anesthesia Transfer of Care Note  Patient: CORDARRYL MONRREAL  Procedure(s) Performed: BILATERAL FUNCTIONAL ENDOSCOPIC SINUS SURGERY WITH IMAGE GUIDANCE; BILATERAL MAXILLARY ANTROSTOMY WITH TISSUE REMOVAL; TOTAL ETHMOIDECTOMY; NASOFRONTAL SINUS EXPLORTION; BILATERAL SPHENOIDOTOMY WITH TISSUE REMOVAL ON LEFT; PLACEMENT OF PROPEL STENTS/FRONTAL BALLOONS (Bilateral: Nose) BILATERAL INFERIOR TURBINATE REDUCTION (Bilateral: Nose)  Patient Location: PACU  Anesthesia Type:General  Level of Consciousness: awake  Airway & Oxygen Therapy: Patient Spontanous Breathing and Patient connected to face mask oxygen  Post-op Assessment: Report given to RN and Post -op Vital signs reviewed and stable  Post vital signs: Reviewed and stable  Last Vitals:  Vitals Value Taken Time  BP 152/95 06/14/21 1438  Temp    Pulse 90 06/14/21 1439  Resp 13 06/14/21 1439  SpO2 99 % 06/14/21 1439  Vitals shown include unvalidated device data.  Last Pain:  Vitals:   06/14/21 1038  TempSrc: Oral  PainSc:          Complications: No notable events documented.

## 2021-06-14 NOTE — Anesthesia Procedure Notes (Signed)
Procedure Name: Intubation Date/Time: 06/14/2021 12:35 PM Performed by: Tawni Millers, CRNA Pre-anesthesia Checklist: Patient identified, Emergency Drugs available, Suction available and Patient being monitored Patient Re-evaluated:Patient Re-evaluated prior to induction Oxygen Delivery Method: Circle system utilized Preoxygenation: Pre-oxygenation with 100% oxygen Induction Type: IV induction Ventilation: Mask ventilation without difficulty Laryngoscope Size: Mac and 3 Grade View: Grade II Tube type: Oral Tube size: 7.5 mm Number of attempts: 1 Airway Equipment and Method: Stylet and Oral airway Placement Confirmation: ETT inserted through vocal cords under direct vision, positive ETCO2 and breath sounds checked- equal and bilateral Tube secured with: Tape Dental Injury: Teeth and Oropharynx as per pre-operative assessment

## 2021-06-14 NOTE — H&P (Signed)
George Mays is an 70 y.o. male.    Chief Complaint:  Nasal polyps, chronic sinusitis  HPI: Patient presents today for planned elective procedure.  He denies any significant change in history since office visit on 03/02/2021. He is currently on oral antibiotics and prednisone taper.  George Mays is a 70 y.o. male who presents as a return patient, referred by Self, A Referral, to discuss options for treatment following recent CT sinus. Patient was initially seen in our office by George Provost, PA for nasal congestion, worse on the left. At that time, he had been prescribed a 2-week course of clindamycin as well as a 9-day taper of oral prednisone. Patient endorses longstanding history of nasal congestion over many years, which he states is temporarily improved with oral antibiotics and steroids. He endorses history of allergic rhinitis and asthma, and is currently on Flonase, Zyrtec and Singulair. He denies history of nasal trauma or sinonasal surgery. He is a current every day smoker and smokes approximately 1 pack/week. He states that he has been smoking for approximately 55 years. He previously had a CT of his sinuses in 2011 which had findings consistent with acute on chronic pansinusitis.  Past Medical History:  Diagnosis Date   Arthritis    Asthma    COPD (chronic obstructive pulmonary disease) ([place])    DVT (deep venous thrombosis) ([place]) 2021   right lower leg   Gallbladder disease    Hypertension    Sleep apnea    does not use c-pap    Past Surgical History:  Procedure Laterality Date   CHOLECYSTECTOMY  08/18/2012   Procedure: LAPAROSCOPIC CHOLECYSTECTOMY;  Surgeon: Donato Heinz, MD;  Location: [facility];  Service: General;  Laterality: N/A;    History reviewed. No pertinent family history.  Social History:  reports that he has been smoking cigarettes. He has a 10.00 pack-year smoking history. He has never used smokeless tobacco. He reports that he does not drink  alcohol and does not use drugs.  Allergies:  Allergies  Allergen Reactions   Almond (Diagnostic)    Aspirin     n/v    Medications Prior to Admission  Medication Sig Dispense Refill   albuterol (VENTOLIN HFA) 108 (90 Base) MCG/ACT inhaler Inhale into the lungs every 6 (six) hours as needed for wheezing or shortness of breath.     amLODipine-olmesartan (AZOR) 5-20 MG tablet Take 1 tablet by mouth daily.     cetirizine (ZYRTEC) 10 MG tablet Take 10 mg by mouth daily.     montelukast (SINGULAIR) 10 MG tablet Take 10 mg by mouth at bedtime.     PREDNISONE PO Take by mouth.     umeclidinium-vilanterol (ANORO ELLIPTA) 62.5-25 MCG/ACT AEPB Inhale 1 puff into the lungs daily.     UNKNOWN TO PATIENT "Antibiotic"      No results found for this or any previous visit (from the past 28 hour(s)). No results found.  ROS: ROS  Blood pressure (!) 130/96, pulse 61, temperature 97.8 F (36.6 C), temperature source Oral, resp. rate (!) 24, height 5\' 11"  (1.803 m), weight 110.5 kg, SpO2 98 %.  PHYSICAL EXAM: Physical Exam Constitutional:      Appearance: Normal appearance.  HENT:     Head: Normocephalic and atraumatic.     Right Ear: External ear normal.     Left Ear: External ear normal.     Nose: Nose normal.     Mouth/Throat:     Mouth: Mucous membranes are moist.  Eyes:     Extraocular Movements: Extraocular movements intact.     Conjunctiva/sclera: Conjunctivae normal.  Neurological:     General: No focal deficit present.     Mental Status: He is alert and oriented to person, place, and time.  Psychiatric:        Mood and Affect: Mood normal.        Behavior: Behavior normal.    Studies Reviewed: Ct sinus reviewed   Assessment/Plan George Mays is a 70 y.o. male with longstanding history of nasal congestion and symptoms of sinusitis with evidence of nasal polyposis on physical exam and imaging. CT sinus also demonstrates partial to total opacification of all paranasal  sinuses. To OR today for bilateral functional endoscopic sinus surgery with image guidance, to include bilateral maxillary antrostomy with tissue removal, total ethmoidectomy, nasofrontal recess exploration, sphenoidotomy, bilateral inferior turbinate reduction with possible use of propel stents. Risks of surgery, including bleeding, infection, damage to surrounding tissues/structures, CSF fluid leak, meningitis, diplopia, vision loss, recurrent disease, scarring, excessive tearing were reviewed with patient. Expected postoperative course and recovery also reviewed, including need for diligent use of intranasal steroid rinses to reduce likelihood of polypoid tissue regrowth.All questions answered.  Patient expressed understanding and agreement.    George Mays A George Mays 06/14/2021, 11:42 AM

## 2021-06-15 ENCOUNTER — Encounter (HOSPITAL_BASED_OUTPATIENT_CLINIC_OR_DEPARTMENT_OTHER): Payer: Self-pay | Admitting: Otolaryngology

## 2021-06-15 LAB — SURGICAL PATHOLOGY

## 2021-06-19 DIAGNOSIS — Z48813 Encounter for surgical aftercare following surgery on the respiratory system: Secondary | ICD-10-CM | POA: Diagnosis not present

## 2021-06-19 DIAGNOSIS — J3489 Other specified disorders of nose and nasal sinuses: Secondary | ICD-10-CM | POA: Diagnosis not present

## 2021-06-19 DIAGNOSIS — Z9889 Other specified postprocedural states: Secondary | ICD-10-CM | POA: Diagnosis not present

## 2021-06-28 DIAGNOSIS — Z9889 Other specified postprocedural states: Secondary | ICD-10-CM | POA: Diagnosis not present

## 2021-06-28 DIAGNOSIS — J441 Chronic obstructive pulmonary disease with (acute) exacerbation: Secondary | ICD-10-CM | POA: Diagnosis not present

## 2021-06-28 DIAGNOSIS — J3489 Other specified disorders of nose and nasal sinuses: Secondary | ICD-10-CM | POA: Diagnosis not present

## 2021-06-28 DIAGNOSIS — Z48813 Encounter for surgical aftercare following surgery on the respiratory system: Secondary | ICD-10-CM | POA: Diagnosis not present

## 2021-06-28 DIAGNOSIS — I10 Essential (primary) hypertension: Secondary | ICD-10-CM | POA: Diagnosis not present

## 2021-06-28 DIAGNOSIS — E785 Hyperlipidemia, unspecified: Secondary | ICD-10-CM | POA: Diagnosis not present

## 2021-07-18 DIAGNOSIS — E785 Hyperlipidemia, unspecified: Secondary | ICD-10-CM | POA: Diagnosis not present

## 2021-07-18 DIAGNOSIS — I10 Essential (primary) hypertension: Secondary | ICD-10-CM | POA: Diagnosis not present

## 2021-07-18 DIAGNOSIS — Z6835 Body mass index (BMI) 35.0-35.9, adult: Secondary | ICD-10-CM | POA: Diagnosis not present

## 2021-07-18 DIAGNOSIS — J449 Chronic obstructive pulmonary disease, unspecified: Secondary | ICD-10-CM | POA: Diagnosis not present

## 2021-07-18 DIAGNOSIS — Z720 Tobacco use: Secondary | ICD-10-CM | POA: Diagnosis not present

## 2021-08-02 DIAGNOSIS — Z961 Presence of intraocular lens: Secondary | ICD-10-CM | POA: Diagnosis not present

## 2021-08-02 DIAGNOSIS — H31013 Macula scars of posterior pole (postinflammatory) (post-traumatic), bilateral: Secondary | ICD-10-CM | POA: Diagnosis not present

## 2021-08-02 DIAGNOSIS — B394 Histoplasmosis capsulati, unspecified: Secondary | ICD-10-CM | POA: Diagnosis not present

## 2021-08-29 DIAGNOSIS — J069 Acute upper respiratory infection, unspecified: Secondary | ICD-10-CM | POA: Diagnosis not present

## 2021-08-29 DIAGNOSIS — J9801 Acute bronchospasm: Secondary | ICD-10-CM | POA: Diagnosis not present

## 2021-08-29 DIAGNOSIS — I10 Essential (primary) hypertension: Secondary | ICD-10-CM | POA: Diagnosis not present

## 2021-08-29 DIAGNOSIS — J449 Chronic obstructive pulmonary disease, unspecified: Secondary | ICD-10-CM | POA: Diagnosis not present

## 2021-09-12 DIAGNOSIS — I10 Essential (primary) hypertension: Secondary | ICD-10-CM | POA: Diagnosis not present

## 2021-09-12 DIAGNOSIS — J441 Chronic obstructive pulmonary disease with (acute) exacerbation: Secondary | ICD-10-CM | POA: Diagnosis not present

## 2021-09-26 DIAGNOSIS — Z833 Family history of diabetes mellitus: Secondary | ICD-10-CM | POA: Diagnosis not present

## 2021-09-26 DIAGNOSIS — Z886 Allergy status to analgesic agent status: Secondary | ICD-10-CM | POA: Diagnosis not present

## 2021-09-26 DIAGNOSIS — J449 Chronic obstructive pulmonary disease, unspecified: Secondary | ICD-10-CM | POA: Diagnosis not present

## 2021-09-26 DIAGNOSIS — E669 Obesity, unspecified: Secondary | ICD-10-CM | POA: Diagnosis not present

## 2021-09-26 DIAGNOSIS — I10 Essential (primary) hypertension: Secondary | ICD-10-CM | POA: Diagnosis not present

## 2021-09-26 DIAGNOSIS — N529 Male erectile dysfunction, unspecified: Secondary | ICD-10-CM | POA: Diagnosis not present

## 2021-09-26 DIAGNOSIS — Z6833 Body mass index (BMI) 33.0-33.9, adult: Secondary | ICD-10-CM | POA: Diagnosis not present

## 2021-09-26 DIAGNOSIS — Z8249 Family history of ischemic heart disease and other diseases of the circulatory system: Secondary | ICD-10-CM | POA: Diagnosis not present

## 2021-09-26 DIAGNOSIS — M1990 Unspecified osteoarthritis, unspecified site: Secondary | ICD-10-CM | POA: Diagnosis not present

## 2021-09-26 DIAGNOSIS — R69 Illness, unspecified: Secondary | ICD-10-CM | POA: Diagnosis not present

## 2021-12-12 DIAGNOSIS — I10 Essential (primary) hypertension: Secondary | ICD-10-CM | POA: Diagnosis not present

## 2021-12-12 DIAGNOSIS — Z6834 Body mass index (BMI) 34.0-34.9, adult: Secondary | ICD-10-CM | POA: Diagnosis not present

## 2021-12-12 DIAGNOSIS — J441 Chronic obstructive pulmonary disease with (acute) exacerbation: Secondary | ICD-10-CM | POA: Diagnosis not present

## 2022-01-02 IMAGING — US US EXTREM LOW VENOUS*R*
1 series · 13 of 24 positions shown · non-contrast
Comparison: 05/28/2013

CLINICAL DATA: 69-year-old with leg swelling for 5 days.



[Series 1: us extrem low venous*right* · 0.08mm/px · 13 of 53 slices shown]
[im 1/53]
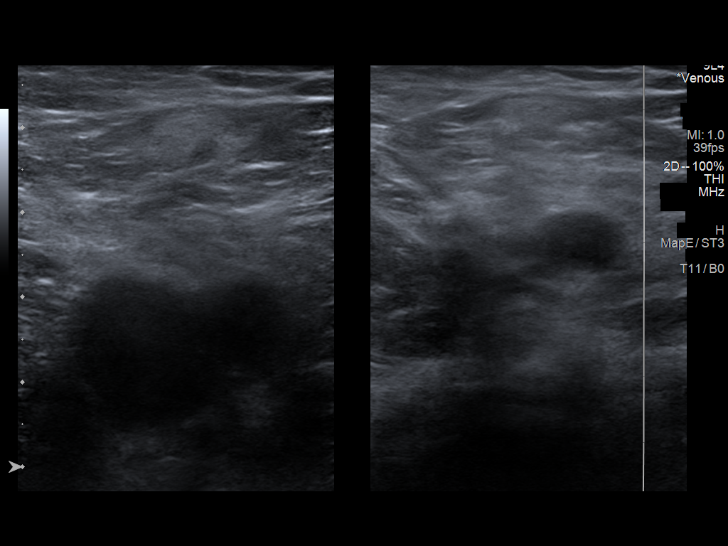
[im 5/53]
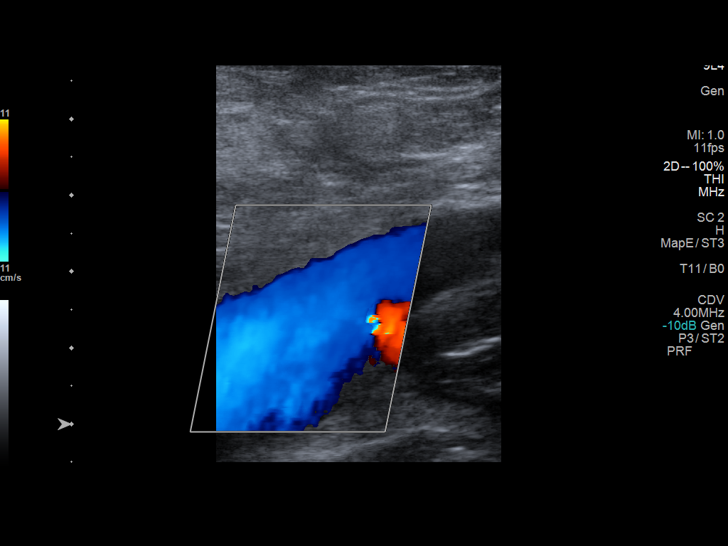
[im 10/53]
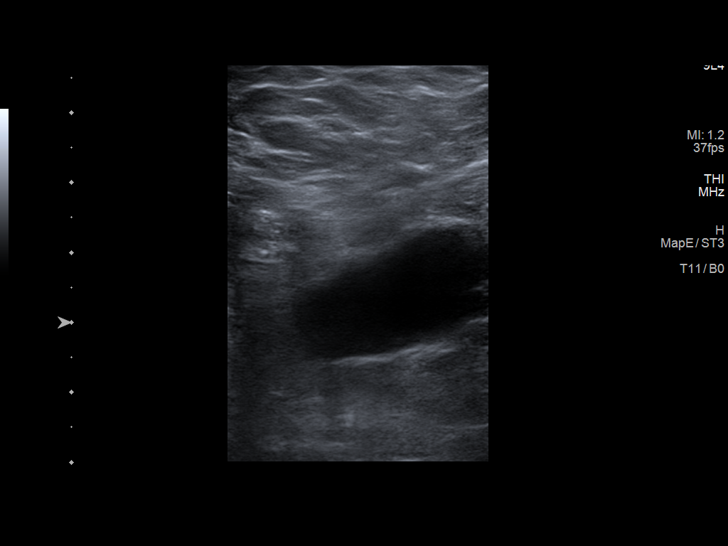
[im 14/53]
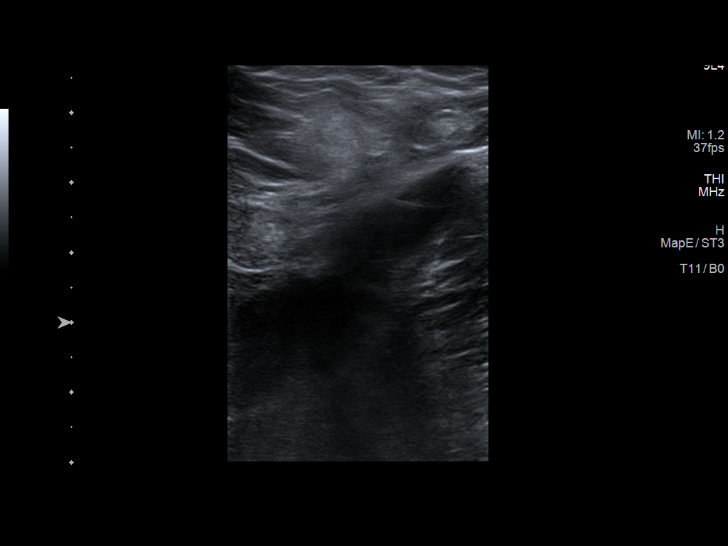
[im 19/53]
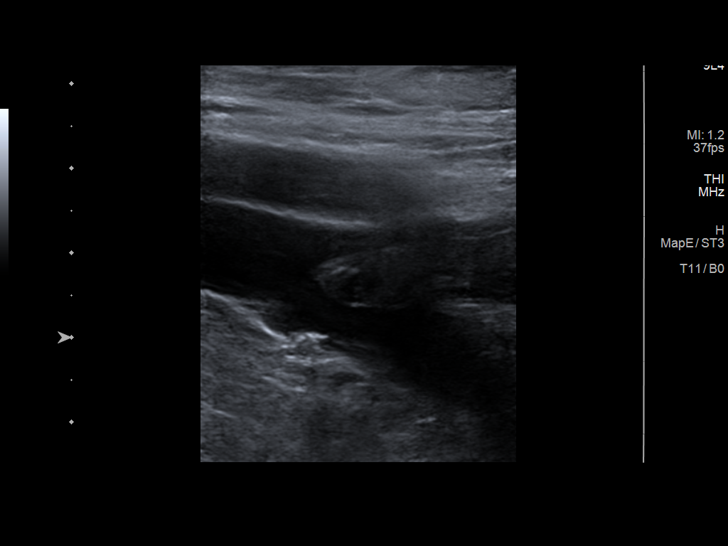
[im 23/53]
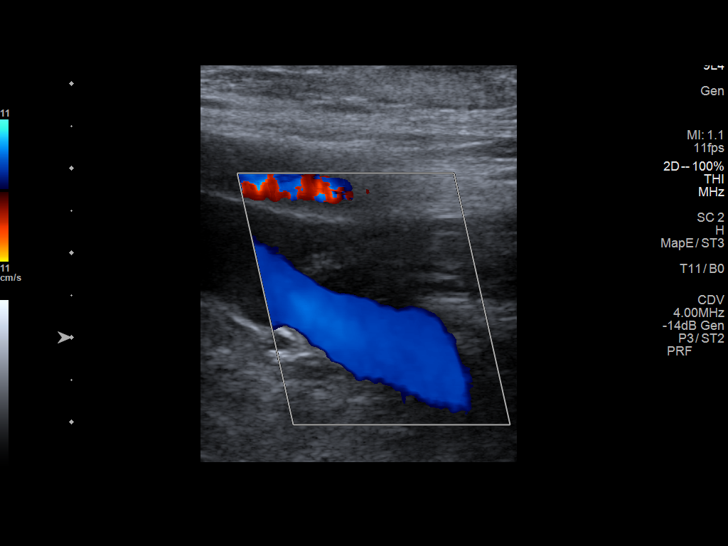
[im 28/53]
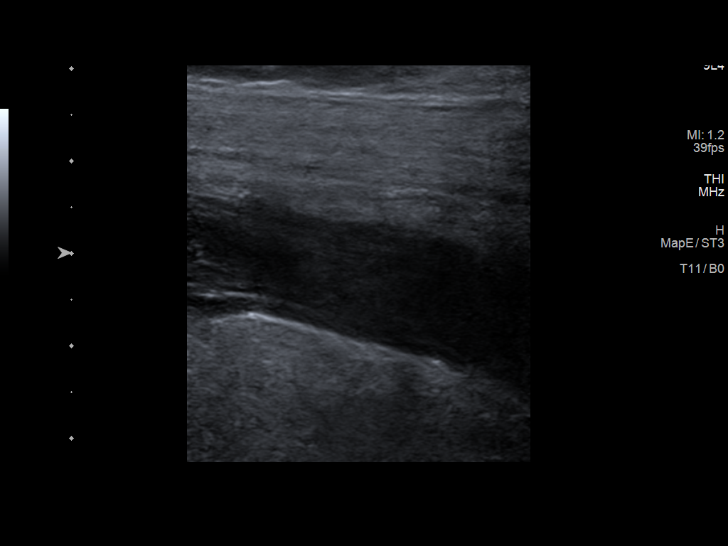
[im 30/53]
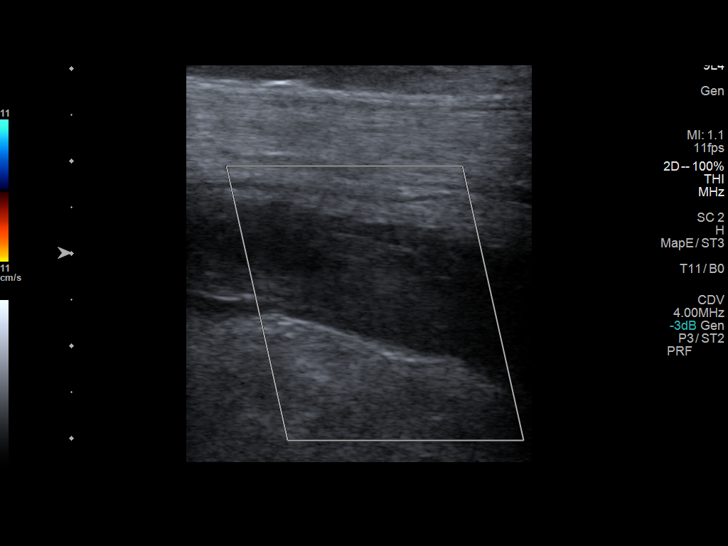
[im 34/53]
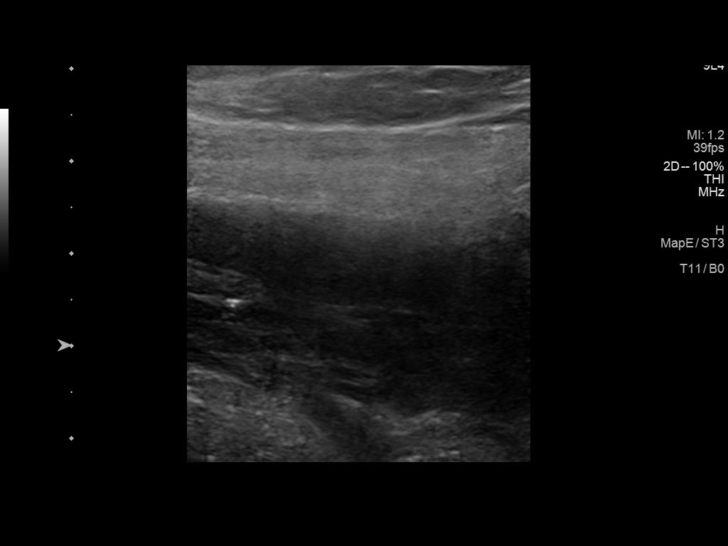
[im 39/53]
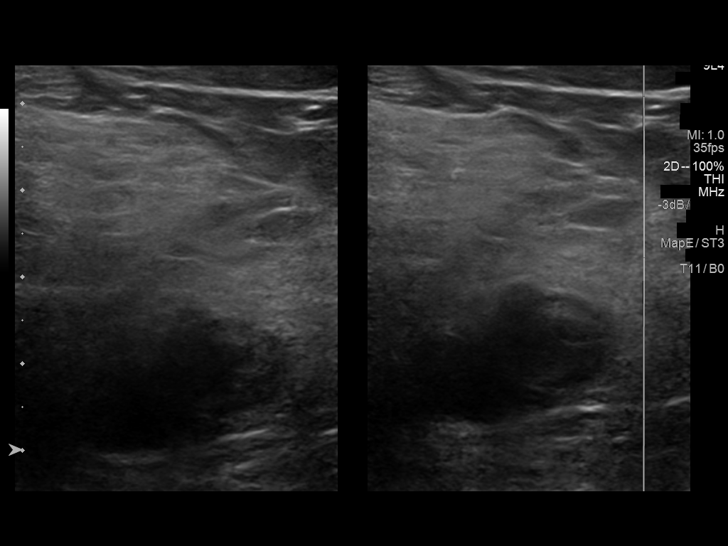
[im 43/53]
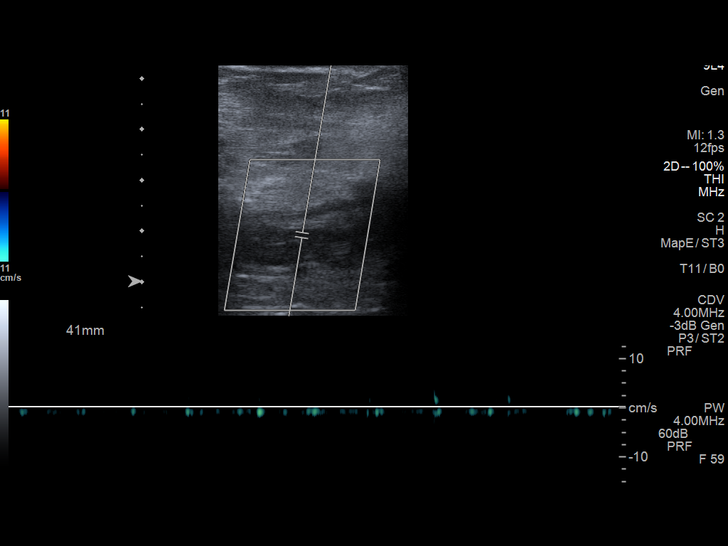
[im 48/53]
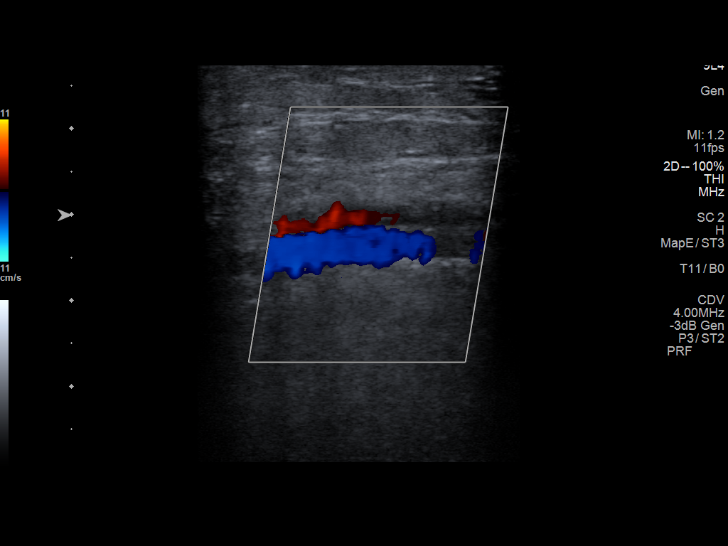
[im 53/53]
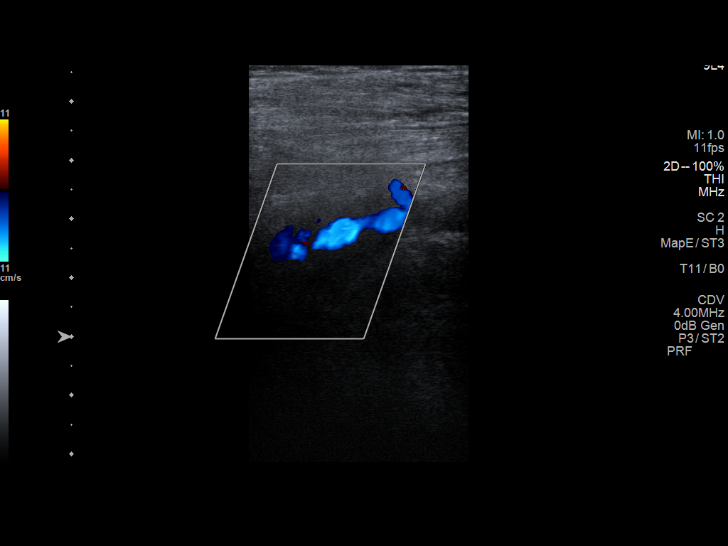

[13 of 24 positions shown; findings below may reference images not displayed]

FINDINGS: Contralateral Common Femoral Vein: Respiratory phasicity is normal
and symmetric with the symptomatic side. No evidence of thrombus.
Normal compressibility.

Common Femoral Vein: No evidence of thrombus. Normal
compressibility, respiratory phasicity and response to augmentation.

Saphenofemoral Junction: No evidence of thrombus. Normal
compressibility and flow on color Doppler imaging.

Profunda Femoral Vein: No evidence of thrombus. Normal color Doppler
flow.

Femoral Vein: Positive for thrombus. Thrombus in the left femoral
vein protrudes into the distal common femoral vein and origin of the
profunda femoral vein. Thrombus throughout the right femoral vein.
The femoral vein thrombus is expansile and occlusive.

Popliteal Vein: Positive for thrombus. Occlusive thrombus in the
right popliteal vein.

Calf Veins: Positive for thrombus. Evidence for thrombus in
posterior tibial and peroneal veins.

Superficial Great Saphenous Vein: No evidence of thrombus. Normal
compressibility.

Other Findings:  None.
IMPRESSION: Positive for thrombus in the right lower extremity. Thrombus
involves the right femoral vein, right popliteal vein and right deep
calf veins. Thrombus in the femoral vein and popliteal vein is
occlusive. In addition, the thrombus in the proximal femoral vein
has a finger-like protrusion into the distal common femoral vein
region.

## 2022-04-17 DIAGNOSIS — Z20822 Contact with and (suspected) exposure to covid-19: Secondary | ICD-10-CM | POA: Diagnosis not present

## 2022-04-17 DIAGNOSIS — J441 Chronic obstructive pulmonary disease with (acute) exacerbation: Secondary | ICD-10-CM | POA: Diagnosis not present

## 2022-04-17 DIAGNOSIS — I10 Essential (primary) hypertension: Secondary | ICD-10-CM | POA: Diagnosis not present

## 2022-04-19 DIAGNOSIS — J449 Chronic obstructive pulmonary disease, unspecified: Secondary | ICD-10-CM | POA: Diagnosis not present

## 2022-04-19 DIAGNOSIS — J441 Chronic obstructive pulmonary disease with (acute) exacerbation: Secondary | ICD-10-CM | POA: Diagnosis not present

## 2022-04-24 DIAGNOSIS — Z0000 Encounter for general adult medical examination without abnormal findings: Secondary | ICD-10-CM | POA: Diagnosis not present

## 2022-04-24 DIAGNOSIS — I10 Essential (primary) hypertension: Secondary | ICD-10-CM | POA: Diagnosis not present

## 2022-04-24 DIAGNOSIS — J449 Chronic obstructive pulmonary disease, unspecified: Secondary | ICD-10-CM | POA: Diagnosis not present

## 2022-04-24 DIAGNOSIS — Z720 Tobacco use: Secondary | ICD-10-CM | POA: Diagnosis not present

## 2022-06-26 DIAGNOSIS — J449 Chronic obstructive pulmonary disease, unspecified: Secondary | ICD-10-CM | POA: Diagnosis not present

## 2022-06-26 DIAGNOSIS — J9801 Acute bronchospasm: Secondary | ICD-10-CM | POA: Diagnosis not present

## 2022-06-26 DIAGNOSIS — I10 Essential (primary) hypertension: Secondary | ICD-10-CM | POA: Diagnosis not present

## 2022-08-28 DIAGNOSIS — J449 Chronic obstructive pulmonary disease, unspecified: Secondary | ICD-10-CM | POA: Diagnosis not present

## 2022-08-28 DIAGNOSIS — I10 Essential (primary) hypertension: Secondary | ICD-10-CM | POA: Diagnosis not present

## 2022-08-29 DIAGNOSIS — I10 Essential (primary) hypertension: Secondary | ICD-10-CM | POA: Diagnosis not present

## 2022-08-29 DIAGNOSIS — Z86010 Personal history of colonic polyps: Secondary | ICD-10-CM | POA: Diagnosis not present

## 2022-08-29 DIAGNOSIS — J449 Chronic obstructive pulmonary disease, unspecified: Secondary | ICD-10-CM | POA: Diagnosis not present

## 2022-09-18 DIAGNOSIS — H40013 Open angle with borderline findings, low risk, bilateral: Secondary | ICD-10-CM | POA: Diagnosis not present

## 2022-09-18 DIAGNOSIS — Z961 Presence of intraocular lens: Secondary | ICD-10-CM | POA: Diagnosis not present

## 2022-09-20 DIAGNOSIS — Z86010 Personal history of colonic polyps: Secondary | ICD-10-CM | POA: Diagnosis not present

## 2022-09-20 DIAGNOSIS — D122 Benign neoplasm of ascending colon: Secondary | ICD-10-CM | POA: Diagnosis not present

## 2022-09-20 DIAGNOSIS — D125 Benign neoplasm of sigmoid colon: Secondary | ICD-10-CM | POA: Diagnosis not present

## 2022-09-20 DIAGNOSIS — K635 Polyp of colon: Secondary | ICD-10-CM | POA: Diagnosis not present

## 2022-09-20 DIAGNOSIS — K5520 Angiodysplasia of colon without hemorrhage: Secondary | ICD-10-CM | POA: Diagnosis not present

## 2022-09-25 DIAGNOSIS — M10062 Idiopathic gout, left knee: Secondary | ICD-10-CM | POA: Diagnosis not present

## 2022-09-25 DIAGNOSIS — M25562 Pain in left knee: Secondary | ICD-10-CM | POA: Diagnosis not present

## 2022-09-25 DIAGNOSIS — J9801 Acute bronchospasm: Secondary | ICD-10-CM | POA: Diagnosis not present

## 2022-10-09 DIAGNOSIS — E7800 Pure hypercholesterolemia, unspecified: Secondary | ICD-10-CM | POA: Diagnosis not present

## 2022-10-09 DIAGNOSIS — I10 Essential (primary) hypertension: Secondary | ICD-10-CM | POA: Diagnosis not present

## 2022-10-09 DIAGNOSIS — N400 Enlarged prostate without lower urinary tract symptoms: Secondary | ICD-10-CM | POA: Diagnosis not present

## 2022-10-09 DIAGNOSIS — Z720 Tobacco use: Secondary | ICD-10-CM | POA: Diagnosis not present

## 2022-10-09 DIAGNOSIS — M25562 Pain in left knee: Secondary | ICD-10-CM | POA: Diagnosis not present

## 2022-11-23 ENCOUNTER — Other Ambulatory Visit (HOSPITAL_COMMUNITY): Payer: Self-pay | Admitting: Family Medicine

## 2022-11-23 DIAGNOSIS — Z720 Tobacco use: Secondary | ICD-10-CM

## 2022-11-27 ENCOUNTER — Ambulatory Visit (HOSPITAL_COMMUNITY): Payer: Medicare HMO

## 2022-11-27 ENCOUNTER — Encounter (HOSPITAL_COMMUNITY): Payer: Self-pay

## 2022-11-27 DIAGNOSIS — J449 Chronic obstructive pulmonary disease, unspecified: Secondary | ICD-10-CM | POA: Diagnosis not present

## 2022-11-27 DIAGNOSIS — Z720 Tobacco use: Secondary | ICD-10-CM | POA: Diagnosis not present

## 2022-11-27 DIAGNOSIS — E785 Hyperlipidemia, unspecified: Secondary | ICD-10-CM | POA: Diagnosis not present

## 2022-12-18 ENCOUNTER — Ambulatory Visit (HOSPITAL_COMMUNITY)
Admission: RE | Admit: 2022-12-18 | Discharge: 2022-12-18 | Disposition: A | Discharge status: Home / Self Care | Payer: Medicare HMO | Source: Ambulatory Visit | Attending: Family Medicine | Admitting: Family Medicine

## 2022-12-18 DIAGNOSIS — J4489 Other specified chronic obstructive pulmonary disease: Secondary | ICD-10-CM | POA: Diagnosis not present

## 2022-12-18 DIAGNOSIS — Z122 Encounter for screening for malignant neoplasm of respiratory organs: Secondary | ICD-10-CM | POA: Insufficient documentation

## 2022-12-18 DIAGNOSIS — N182 Chronic kidney disease, stage 2 (mild): Secondary | ICD-10-CM | POA: Diagnosis not present

## 2022-12-18 DIAGNOSIS — N529 Male erectile dysfunction, unspecified: Secondary | ICD-10-CM | POA: Diagnosis not present

## 2022-12-18 DIAGNOSIS — R918 Other nonspecific abnormal finding of lung field: Secondary | ICD-10-CM | POA: Diagnosis not present

## 2022-12-18 DIAGNOSIS — Z720 Tobacco use: Secondary | ICD-10-CM | POA: Insufficient documentation

## 2022-12-18 DIAGNOSIS — I129 Hypertensive chronic kidney disease with stage 1 through stage 4 chronic kidney disease, or unspecified chronic kidney disease: Secondary | ICD-10-CM | POA: Diagnosis not present

## 2022-12-18 DIAGNOSIS — Z8249 Family history of ischemic heart disease and other diseases of the circulatory system: Secondary | ICD-10-CM | POA: Diagnosis not present

## 2022-12-18 DIAGNOSIS — E785 Hyperlipidemia, unspecified: Secondary | ICD-10-CM | POA: Diagnosis not present

## 2022-12-18 DIAGNOSIS — E669 Obesity, unspecified: Secondary | ICD-10-CM | POA: Diagnosis not present

## 2022-12-18 DIAGNOSIS — Z823 Family history of stroke: Secondary | ICD-10-CM | POA: Diagnosis not present

## 2022-12-18 DIAGNOSIS — Z008 Encounter for other general examination: Secondary | ICD-10-CM | POA: Diagnosis not present

## 2022-12-18 DIAGNOSIS — F17200 Nicotine dependence, unspecified, uncomplicated: Secondary | ICD-10-CM | POA: Diagnosis not present

## 2022-12-18 DIAGNOSIS — I2510 Atherosclerotic heart disease of native coronary artery without angina pectoris: Secondary | ICD-10-CM | POA: Diagnosis not present

## 2022-12-18 DIAGNOSIS — J439 Emphysema, unspecified: Secondary | ICD-10-CM | POA: Insufficient documentation

## 2022-12-18 DIAGNOSIS — F17210 Nicotine dependence, cigarettes, uncomplicated: Secondary | ICD-10-CM | POA: Diagnosis not present

## 2022-12-18 DIAGNOSIS — Z961 Presence of intraocular lens: Secondary | ICD-10-CM | POA: Diagnosis not present

## 2022-12-18 DIAGNOSIS — Z6832 Body mass index (BMI) 32.0-32.9, adult: Secondary | ICD-10-CM | POA: Diagnosis not present

## 2022-12-18 DIAGNOSIS — M1990 Unspecified osteoarthritis, unspecified site: Secondary | ICD-10-CM | POA: Diagnosis not present

## 2022-12-25 ENCOUNTER — Encounter (HOSPITAL_COMMUNITY): Payer: Self-pay | Admitting: Family Medicine

## 2022-12-27 ENCOUNTER — Other Ambulatory Visit (HOSPITAL_COMMUNITY): Payer: Self-pay | Admitting: Family Medicine

## 2022-12-27 DIAGNOSIS — J438 Other emphysema: Secondary | ICD-10-CM

## 2023-01-01 DIAGNOSIS — J441 Chronic obstructive pulmonary disease with (acute) exacerbation: Secondary | ICD-10-CM | POA: Diagnosis not present

## 2023-04-16 DIAGNOSIS — B394 Histoplasmosis capsulati, unspecified: Secondary | ICD-10-CM | POA: Diagnosis not present

## 2023-04-16 DIAGNOSIS — Z6831 Body mass index (BMI) 31.0-31.9, adult: Secondary | ICD-10-CM | POA: Diagnosis not present

## 2023-04-16 DIAGNOSIS — H40013 Open angle with borderline findings, low risk, bilateral: Secondary | ICD-10-CM | POA: Diagnosis not present

## 2023-04-16 DIAGNOSIS — I10 Essential (primary) hypertension: Secondary | ICD-10-CM | POA: Diagnosis not present

## 2023-04-16 DIAGNOSIS — J440 Chronic obstructive pulmonary disease with acute lower respiratory infection: Secondary | ICD-10-CM | POA: Diagnosis not present

## 2023-04-16 DIAGNOSIS — H31013 Macula scars of posterior pole (postinflammatory) (post-traumatic), bilateral: Secondary | ICD-10-CM | POA: Diagnosis not present

## 2023-04-16 DIAGNOSIS — Z961 Presence of intraocular lens: Secondary | ICD-10-CM | POA: Diagnosis not present

## 2023-04-23 DIAGNOSIS — J440 Chronic obstructive pulmonary disease with acute lower respiratory infection: Secondary | ICD-10-CM | POA: Diagnosis not present

## 2023-06-18 DIAGNOSIS — M150 Primary generalized (osteo)arthritis: Secondary | ICD-10-CM | POA: Diagnosis not present

## 2023-06-18 DIAGNOSIS — J449 Chronic obstructive pulmonary disease, unspecified: Secondary | ICD-10-CM | POA: Diagnosis not present

## 2023-06-20 ENCOUNTER — Ambulatory Visit (HOSPITAL_COMMUNITY): Payer: Medicare PPO

## 2023-06-20 ENCOUNTER — Encounter (HOSPITAL_COMMUNITY): Payer: Self-pay

## 2023-07-09 DIAGNOSIS — Z6831 Body mass index (BMI) 31.0-31.9, adult: Secondary | ICD-10-CM | POA: Diagnosis not present

## 2023-07-09 DIAGNOSIS — M150 Primary generalized (osteo)arthritis: Secondary | ICD-10-CM | POA: Diagnosis not present

## 2023-07-09 DIAGNOSIS — J449 Chronic obstructive pulmonary disease, unspecified: Secondary | ICD-10-CM | POA: Diagnosis not present

## 2023-07-09 DIAGNOSIS — I10 Essential (primary) hypertension: Secondary | ICD-10-CM | POA: Diagnosis not present

## 2023-07-11 ENCOUNTER — Ambulatory Visit (HOSPITAL_COMMUNITY)
Admission: RE | Admit: 2023-07-11 | Discharge: 2023-07-11 | Disposition: A | Discharge status: Home / Self Care | Payer: Medicare PPO | Source: Ambulatory Visit | Attending: Family Medicine | Admitting: Family Medicine

## 2023-07-11 DIAGNOSIS — J438 Other emphysema: Secondary | ICD-10-CM | POA: Diagnosis not present

## 2023-07-11 DIAGNOSIS — J432 Centrilobular emphysema: Secondary | ICD-10-CM | POA: Diagnosis not present

## 2023-07-11 DIAGNOSIS — I700 Atherosclerosis of aorta: Secondary | ICD-10-CM | POA: Diagnosis not present

## 2023-09-10 DIAGNOSIS — I10 Essential (primary) hypertension: Secondary | ICD-10-CM | POA: Diagnosis not present

## 2023-09-10 DIAGNOSIS — J449 Chronic obstructive pulmonary disease, unspecified: Secondary | ICD-10-CM | POA: Diagnosis not present

## 2023-09-10 DIAGNOSIS — M150 Primary generalized (osteo)arthritis: Secondary | ICD-10-CM | POA: Diagnosis not present

## 2023-09-10 DIAGNOSIS — M5416 Radiculopathy, lumbar region: Secondary | ICD-10-CM | POA: Diagnosis not present

## 2023-09-10 DIAGNOSIS — Z0000 Encounter for general adult medical examination without abnormal findings: Secondary | ICD-10-CM | POA: Diagnosis not present

## 2023-09-10 DIAGNOSIS — Z720 Tobacco use: Secondary | ICD-10-CM | POA: Diagnosis not present

## 2023-09-12 ENCOUNTER — Other Ambulatory Visit (HOSPITAL_COMMUNITY): Payer: Self-pay | Admitting: Family Medicine

## 2023-09-12 DIAGNOSIS — M545 Low back pain, unspecified: Secondary | ICD-10-CM

## 2023-09-12 DIAGNOSIS — M130 Polyarthritis, unspecified: Secondary | ICD-10-CM

## 2023-09-13 ENCOUNTER — Ambulatory Visit (HOSPITAL_COMMUNITY)
Admission: RE | Admit: 2023-09-13 | Discharge: 2023-09-13 | Disposition: A | Discharge status: Home / Self Care | Payer: Medicare PPO | Source: Ambulatory Visit | Attending: Family Medicine | Admitting: Family Medicine

## 2023-09-13 DIAGNOSIS — M130 Polyarthritis, unspecified: Secondary | ICD-10-CM | POA: Insufficient documentation

## 2023-09-13 DIAGNOSIS — M48061 Spinal stenosis, lumbar region without neurogenic claudication: Secondary | ICD-10-CM | POA: Diagnosis not present

## 2023-09-13 DIAGNOSIS — M47816 Spondylosis without myelopathy or radiculopathy, lumbar region: Secondary | ICD-10-CM | POA: Diagnosis not present

## 2023-09-13 DIAGNOSIS — M545 Low back pain, unspecified: Secondary | ICD-10-CM | POA: Diagnosis not present

## 2023-09-13 MED ORDER — IOHEXOL 300 MG/ML  SOLN
100.0000 mL | Freq: Once | INTRAMUSCULAR | Status: AC | PRN
  Administered 2023-09-13: 100 mL via INTRAVENOUS

## 2023-10-08 DIAGNOSIS — M25551 Pain in right hip: Secondary | ICD-10-CM | POA: Diagnosis not present

## 2023-10-08 DIAGNOSIS — J449 Chronic obstructive pulmonary disease, unspecified: Secondary | ICD-10-CM | POA: Diagnosis not present

## 2023-10-08 DIAGNOSIS — Z720 Tobacco use: Secondary | ICD-10-CM | POA: Diagnosis not present

## 2023-10-08 DIAGNOSIS — M150 Primary generalized (osteo)arthritis: Secondary | ICD-10-CM | POA: Diagnosis not present

## 2023-10-08 DIAGNOSIS — I700 Atherosclerosis of aorta: Secondary | ICD-10-CM | POA: Diagnosis not present

## 2023-10-08 DIAGNOSIS — M5416 Radiculopathy, lumbar region: Secondary | ICD-10-CM | POA: Diagnosis not present

## 2023-10-08 DIAGNOSIS — I10 Essential (primary) hypertension: Secondary | ICD-10-CM | POA: Diagnosis not present

## 2023-12-16 DIAGNOSIS — H31013 Macula scars of posterior pole (postinflammatory) (post-traumatic), bilateral: Secondary | ICD-10-CM | POA: Diagnosis not present

## 2023-12-16 DIAGNOSIS — B394 Histoplasmosis capsulati, unspecified: Secondary | ICD-10-CM | POA: Diagnosis not present

## 2023-12-16 DIAGNOSIS — H401131 Primary open-angle glaucoma, bilateral, mild stage: Secondary | ICD-10-CM | POA: Diagnosis not present

## 2023-12-16 DIAGNOSIS — Z961 Presence of intraocular lens: Secondary | ICD-10-CM | POA: Diagnosis not present

## 2023-12-31 ENCOUNTER — Other Ambulatory Visit: Payer: Self-pay | Admitting: Family Medicine

## 2023-12-31 ENCOUNTER — Ambulatory Visit
Admission: RE | Admit: 2023-12-31 | Discharge: 2023-12-31 | Disposition: A | Discharge status: Home / Self Care | Source: Ambulatory Visit | Attending: Family Medicine | Admitting: Family Medicine

## 2023-12-31 DIAGNOSIS — M25551 Pain in right hip: Secondary | ICD-10-CM | POA: Diagnosis not present

## 2023-12-31 DIAGNOSIS — Z720 Tobacco use: Secondary | ICD-10-CM | POA: Diagnosis not present

## 2023-12-31 DIAGNOSIS — G8929 Other chronic pain: Secondary | ICD-10-CM | POA: Diagnosis not present

## 2023-12-31 DIAGNOSIS — M150 Primary generalized (osteo)arthritis: Secondary | ICD-10-CM | POA: Diagnosis not present

## 2023-12-31 DIAGNOSIS — I10 Essential (primary) hypertension: Secondary | ICD-10-CM | POA: Diagnosis not present

## 2023-12-31 DIAGNOSIS — J449 Chronic obstructive pulmonary disease, unspecified: Secondary | ICD-10-CM | POA: Diagnosis not present

## 2024-01-08 DIAGNOSIS — H401131 Primary open-angle glaucoma, bilateral, mild stage: Secondary | ICD-10-CM | POA: Diagnosis not present

## 2024-01-14 DIAGNOSIS — M1611 Unilateral primary osteoarthritis, right hip: Secondary | ICD-10-CM | POA: Diagnosis not present

## 2024-01-14 DIAGNOSIS — M25551 Pain in right hip: Secondary | ICD-10-CM | POA: Diagnosis not present

## 2024-02-18 DIAGNOSIS — H401131 Primary open-angle glaucoma, bilateral, mild stage: Secondary | ICD-10-CM | POA: Diagnosis not present

## 2024-03-11 DIAGNOSIS — H401131 Primary open-angle glaucoma, bilateral, mild stage: Secondary | ICD-10-CM | POA: Diagnosis not present

## 2024-03-11 DIAGNOSIS — Z961 Presence of intraocular lens: Secondary | ICD-10-CM | POA: Diagnosis not present

## 2024-04-08 DIAGNOSIS — Z961 Presence of intraocular lens: Secondary | ICD-10-CM | POA: Diagnosis not present

## 2024-04-08 DIAGNOSIS — H401131 Primary open-angle glaucoma, bilateral, mild stage: Secondary | ICD-10-CM | POA: Diagnosis not present

## 2024-04-14 DIAGNOSIS — M1651 Unilateral post-traumatic osteoarthritis, right hip: Secondary | ICD-10-CM | POA: Diagnosis not present

## 2024-04-14 DIAGNOSIS — J9801 Acute bronchospasm: Secondary | ICD-10-CM | POA: Diagnosis not present

## 2024-04-14 DIAGNOSIS — J441 Chronic obstructive pulmonary disease with (acute) exacerbation: Secondary | ICD-10-CM | POA: Diagnosis not present

## 2024-04-14 DIAGNOSIS — J219 Acute bronchiolitis, unspecified: Secondary | ICD-10-CM | POA: Diagnosis not present

## 2024-04-14 DIAGNOSIS — Z23 Encounter for immunization: Secondary | ICD-10-CM | POA: Diagnosis not present

## 2024-09-02 ENCOUNTER — Encounter: Payer: Self-pay | Admitting: *Deleted

## 2024-09-02 NOTE — Progress Notes (Signed)
 George Mays                                          MRN: [id number]   09/02/2024   The [facility] Quality Team Specialist reviewed this patient medical record for the purposes of chart review for care gap closure. The following were reviewed: chart review for care gap closure-controlling blood pressure.    [facility] Quality Team
